# Patient Record
Sex: Female | Born: 1990 | Race: Black or African American | Hispanic: No | Marital: Single | Smoking: Never smoker
Health system: Southern US, Community
[De-identification: ages and names within clinical notes are randomized; demographics above are authoritative.]

## PROBLEM LIST (undated history)

## (undated) DIAGNOSIS — D649 Anemia, unspecified: Secondary | ICD-10-CM

---

## 2014-12-14 DIAGNOSIS — D649 Anemia, unspecified: Secondary | ICD-10-CM

## 2014-12-14 HISTORY — DX: Anemia, unspecified: D64.9

## 2015-10-24 ENCOUNTER — Emergency Department (HOSPITAL_BASED_OUTPATIENT_CLINIC_OR_DEPARTMENT_OTHER)
Admission: EM | Admit: 2015-10-24 | Discharge: 2015-10-24 | Disposition: A | Discharge status: Home / Self Care | Payer: Self-pay | Attending: Emergency Medicine | Admitting: Emergency Medicine

## 2015-10-24 ENCOUNTER — Emergency Department (HOSPITAL_BASED_OUTPATIENT_CLINIC_OR_DEPARTMENT_OTHER): Payer: Self-pay

## 2015-10-24 ENCOUNTER — Encounter (HOSPITAL_BASED_OUTPATIENT_CLINIC_OR_DEPARTMENT_OTHER): Payer: Self-pay | Admitting: *Deleted

## 2015-10-24 DIAGNOSIS — Z3202 Encounter for pregnancy test, result negative: Secondary | ICD-10-CM | POA: Insufficient documentation

## 2015-10-24 DIAGNOSIS — R1110 Vomiting, unspecified: Secondary | ICD-10-CM | POA: Insufficient documentation

## 2015-10-24 DIAGNOSIS — D649 Anemia, unspecified: Secondary | ICD-10-CM

## 2015-10-24 DIAGNOSIS — R0789 Other chest pain: Secondary | ICD-10-CM

## 2015-10-24 LAB — CBC WITH DIFFERENTIAL/PLATELET
BASOS PCT: 0 %
Basophils Absolute: 0 10*3/uL (ref 0.0–0.1)
EOS PCT: 1 %
Eosinophils Absolute: 0.1 10*3/uL (ref 0.0–0.7)
HEMATOCRIT: 29.6 % — AB (ref 36.0–46.0)
HEMOGLOBIN: 8.9 g/dL — AB (ref 12.0–15.0)
LYMPHS ABS: 1.9 10*3/uL (ref 0.7–4.0)
Lymphocytes Relative: 31 %
MCH: 21.4 pg — AB (ref 26.0–34.0)
MCHC: 30.1 g/dL (ref 30.0–36.0)
MCV: 71.2 fL — AB (ref 78.0–100.0)
MONO ABS: 0.5 10*3/uL (ref 0.1–1.0)
MONOS PCT: 9 %
NEUTROS ABS: 3.6 10*3/uL (ref 1.7–7.7)
Neutrophils Relative %: 59 %
Platelets: 354 10*3/uL (ref 150–400)
RBC: 4.16 MIL/uL (ref 3.87–5.11)
RDW: 16.7 % — AB (ref 11.5–15.5)
WBC: 6.1 10*3/uL (ref 4.0–10.5)

## 2015-10-24 LAB — URINALYSIS, ROUTINE W REFLEX MICROSCOPIC
Bilirubin Urine: NEGATIVE
GLUCOSE, UA: NEGATIVE mg/dL
Hgb urine dipstick: NEGATIVE
Ketones, ur: 15 mg/dL — AB
Nitrite: POSITIVE — AB
PROTEIN: NEGATIVE mg/dL
SPECIFIC GRAVITY, URINE: 1.035 — AB (ref 1.005–1.030)
Urobilinogen, UA: 1 mg/dL (ref 0.0–1.0)
pH: 5 (ref 5.0–8.0)

## 2015-10-24 LAB — BASIC METABOLIC PANEL
Anion gap: 5 (ref 5–15)
BUN: 12 mg/dL (ref 6–20)
CHLORIDE: 108 mmol/L (ref 101–111)
CO2: 24 mmol/L (ref 22–32)
CREATININE: 0.63 mg/dL (ref 0.44–1.00)
Calcium: 8.9 mg/dL (ref 8.9–10.3)
GFR calc non Af Amer: >60 mL/min (ref >60)
GLUCOSE: 95 mg/dL (ref 65–99)
Potassium: 3.1 mmol/L — ABNORMAL LOW (ref 3.5–5.1)
Sodium: 137 mmol/L (ref 135–145)

## 2015-10-24 LAB — RETICULOCYTES
RBC.: 3.9 MIL/uL (ref 3.87–5.11)
RETIC COUNT ABSOLUTE: 42.9 10*3/uL (ref 19.0–186.0)
Retic Ct Pct: 1.1 % (ref 0.4–3.1)

## 2015-10-24 LAB — IRON AND TIBC
Iron: 13 ug/dL — ABNORMAL LOW (ref 28–170)
Saturation Ratios: 3 % — ABNORMAL LOW (ref 10.4–31.8)
TIBC: 405 ug/dL (ref 250–450)
UIBC: 392 ug/dL

## 2015-10-24 LAB — VITAMIN B12: Vitamin B-12: 377 pg/mL (ref 180–914)

## 2015-10-24 LAB — FOLATE: FOLATE: 10 ng/mL (ref >5.9)

## 2015-10-24 LAB — PREGNANCY, URINE: Preg Test, Ur: NEGATIVE

## 2015-10-24 LAB — URINE MICROSCOPIC-ADD ON

## 2015-10-24 LAB — FERRITIN: FERRITIN: 2 ng/mL — AB (ref 11–307)

## 2015-10-24 MED ORDER — FERROUS SULFATE 325 (65 FE) MG PO TABS: 325.0000 mg | ORAL_TABLET | Freq: Every day | ORAL | Status: AC

## 2015-10-24 NOTE — ED Notes (Signed)
Patient transported to X-ray 

## 2015-10-24 NOTE — ED Notes (Signed)
Pt sitting up in stretcher, smiling in nad. Pt states she feels much better after the aerosol treatment. Mom is at bedside.

## 2015-10-24 NOTE — Discharge Instructions (Signed)
Anemia, Nonspecific Anemia is a condition in which the concentration of red blood cells or hemoglobin in the blood is below normal. Hemoglobin is a substance in red blood cells that carries oxygen to the tissues of the body. Anemia results in not enough oxygen reaching these tissues.  CAUSES  Common causes of anemia include:   Excessive bleeding. Bleeding may be internal or external. This includes excessive bleeding from periods (in women) or from the intestine.   Poor nutrition.   Chronic kidney, thyroid, and liver disease.  Bone marrow disorders that decrease red blood cell production.  Cancer and treatments for cancer.  HIV, AIDS, and their treatments.  Spleen problems that increase red blood cell destruction.  Blood disorders.  Excess destruction of red blood cells due to infection, medicines, and autoimmune disorders. SIGNS AND SYMPTOMS   Minor weakness.   Dizziness.   Headache.  Palpitations.   Shortness of breath, especially with exercise.   Paleness.  Cold sensitivity.  Indigestion.  Nausea.  Difficulty sleeping.  Difficulty concentrating. Symptoms may occur suddenly or they may develop slowly.  DIAGNOSIS  Additional blood tests are often needed. These help your health care provider determine the best treatment. Your health care provider will check your stool for blood and look for other causes of blood loss.  TREATMENT  Treatment varies depending on the cause of the anemia. Treatment can include:   Supplements of iron, vitamin B12, or folic acid.   Hormone medicines.   A blood transfusion. This may be needed if blood loss is severe.   Hospitalization. This may be needed if there is significant continual blood loss.   Dietary changes.  Spleen removal. HOME CARE INSTRUCTIONS Keep all follow-up appointments. It often takes many weeks to correct anemia, and having your health care provider check on your condition and your response to  treatment is very important. SEEK IMMEDIATE MEDICAL CARE IF:   You develop extreme weakness, shortness of breath, or chest pain.   You become dizzy or have trouble concentrating.  You develop heavy vaginal bleeding.   You develop a rash.   You have bloody or black, tarry stools.   You faint.   You vomit up blood.   You vomit repeatedly.   You have abdominal pain.  You have a fever or persistent symptoms for more than 2-3 days.   You have a fever and your symptoms suddenly get worse.   You are dehydrated.  MAKE SURE YOU:  Understand these instructions.  Will watch your condition.  Will get help right away if you are not doing well or get worse.   This information is not intended to replace advice given to you by your health care provider. Make sure you discuss any questions you have with your health care provider.   Document Released: 01/07/2005 Document Revised: 08/02/2013 Document Reviewed: 05/26/2013 Elsevier Interactive Patient Education 2016 [employer]  

## 2015-10-24 NOTE — ED Notes (Signed)
Pt to room 9 by ems via stretcher. Pt reports using cleaning fluids this morning at work and becoming sob/"wheezy" and having emesis due to bowel movement on linens. Pt states she felt fine yesterday, "just a little tired". Pt has albuterol treatment in progress by mask, pt states she is feeling much better with the aerosol treatment.

## 2015-10-24 NOTE — ED Notes (Signed)
Pt informed of need for ua, given instructions for cc ua and labeled cup. Will notify staff when able to obtain.

## 2015-10-24 NOTE — ED Provider Notes (Addendum)
CSN: [id number]     Arrival date & time 10/24/15  1041 History   First MD Initiated Contact with Patient 10/24/15 1055     Chief Complaint  Patient presents with  . Shortness of Breath     (Consider location/radiation/quality/duration/timing/severity/associated sxs/prior Treatment) HPI Comments: Patient is a 24 year old female with no past medical history who presents today from work with shortness of breath, sensation that her throat is closing up and wheezing. Patient states she felt her normal self when going to work today other than being tired. 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 She states today there were sheets that it had feces on them that smelled horrible causing her to gag and vomit. She went back to get a second load of laundry in the same thing happened. When this happened a second time then her throat started feeling like it was closing up she became short of breath and developed chest pain. However when patient describes her past chest pain and has been ongoing for the last 1 month. She has it at least every day that is not getting better. She was initially seen at [facility] and told everything was normal however her symptoms have not improved. The pain is generalized sharp pain in the upper portion of her chest that does not radiate. It is not associated with shortness of breath. No cough, abdominal pain, nausea or vomiting. Is not related to being. She denies any history of asthma. No recent travel, estrogen use or immobilization. No unilateral leg pain or swelling.  Patient is a 24 y.o. female presenting with shortness of breath. The history is provided by the patient.  Shortness of Breath   History reviewed. No pertinent past medical history. History reviewed. No pertinent past surgical history. History reviewed. No pertinent family history. Social History  Substance Use Topics  . Smoking status: Never Smoker   . Smokeless tobacco: None  .  Alcohol Use: None   OB History    No data available     Review of Systems  Respiratory: Positive for shortness of breath.   All other systems reviewed and are negative.     Allergies  Review of patient's allergies indicates no known allergies.  Home Medications   Prior to Admission medications   Not on File   BP 136/69 mmHg  Pulse 72  Temp(Src) 98.6 F (37 C) (Oral)  Resp 22  SpO2 100%  LMP 10/03/2015 Physical Exam  Constitutional: She is oriented to person, place, and time. She appears well-developed and well-nourished. No distress.  HENT:  Head: Normocephalic and atraumatic.  Mouth/Throat: Oropharynx is clear and moist.  Eyes: Conjunctivae and EOM are normal. Pupils are equal, round, and reactive to light.  Neck: Normal range of motion. Neck supple.  Cardiovascular: Normal rate, regular rhythm and intact distal pulses.   No murmur heard. Pulmonary/Chest: Effort normal and breath sounds normal. No respiratory distress. She has no wheezes. She has no rales. She exhibits no tenderness.  Abdominal: Soft. She exhibits no distension. There is no tenderness. There is no rebound and no guarding.  Musculoskeletal: Normal range of motion. She exhibits no edema or tenderness.  Neurological: She is alert and oriented to person, place, and time.  Skin: Skin is warm and dry. No rash noted. No erythema.  Psychiatric: She has a normal mood and affect. Her behavior is normal.  Nursing note and vitals reviewed.   ED Course  Procedures (including critical care time) Labs Review  Labs Reviewed  CBC WITH DIFFERENTIAL/PLATELET - Abnormal; Notable for the following:    Hemoglobin 8.9 (*)    HCT 29.6 (*)    MCV 71.2 (*)    MCH 21.4 (*)    RDW 16.7 (*)    All other components within normal limits  BASIC METABOLIC PANEL - Abnormal; Notable for the following:    Potassium 3.1 (*)    All other components within normal limits  URINALYSIS, ROUTINE W REFLEX [facility]CROSCOPIC ([facility]) -  Abnormal; Notable for the following:    APPearance CLOUDY (*)    Specific Gravity, Urine 1.035 (*)    Ketones, ur 15 (*)    Nitrite POSITIVE (*)    Leukocytes, UA TRACE (*)    All other components within normal limits  URINE MICROSCOPIC-ADD ON - Abnormal; Notable for the following:    Squamous Epithelial / LPF FEW (*)    Bacteria, UA MANY (*)    All other components within normal limits  PREGNANCY, URINE  VITAMIN B12  FOLATE  IRON AND TIBC  FERRITIN  RETICULOCYTES    Imaging Review Dg Chest 2 View  10/24/2015  CLINICAL DATA:  24 year old with acute onset of shortness of breath and wheezing earlier today while using unspecified cleaning solution. EXAM: CHEST  2 VIEW COMPARISON:  None. FINDINGS: Cardiomediastinal silhouette unremarkable. Lungs clear. Bronchovascular markings normal. Pulmonary vascularity normal. No pneumothorax. No pleural effusions. Visualized bony thorax intact. IMPRESSION: Normal examination. Electronically Signed   By: Hulan Saas M.D.   On: 10/24/2015 12:26   I have personally reviewed and evaluated these images and lab results as part of my medical decision-making.  ED ECG REPORT   Date: 10/24/2015  Rate: 71  Rhythm: normal sinus rhythm and sinus arrhythmia  QRS Axis: normal  Intervals: normal  ST/T Wave abnormalities: nonspecific T wave changes  Conduction Disutrbances:none  Narrative Interpretation:   Old EKG Reviewed: none available  I have personally reviewed the EKG tracing and agree with the computerized printout as noted.   MDM   Final diagnoses:  Anemia, unspecified anemia type  Atypical chest pain   patient is a 24 year old healthy female who presents for 2 separate issues. Initially she had 2 episodes of vomiting today while at work but the last episode causing a sensation of swelling in her throat and inability to get a deep breath. EMS arrived they gave her albuterol which made her feel better.  On exam there is no sign of allergic  reaction, wheezing, tachycardia or hypoxia. Feel like this is most likely a reaction to smelling foul odor that made her vomit.  Secondly patient has been having chest pain for the last 1 month that is intermittent in nature and not related to exertion, eating, deep breaths. She has no prior history of asthma she is a nonsmoker and does not use alcohol or drugs. She has regular periods which are sometimes heavy. No prior history of anemia. EKG today shows nonspecific T-wave abnormality but no acute findings otherwise. Chest x-ray within normal limits. Blood pressure within normal limits. Hemoglobin today shows evidence of anemia with a hemoglobin of 8.9 with a large platelets, stomatocytes and target cells. Mild hypokalemia but otherwise normal renal function. Patient's urine was nitrite and leukocyte positive however only 3-6 white blood cells and patient denies any urinary symptoms. Urine pregnancy test is negative.  Gave that patient follow-up information with health and wellness. She was started on iron and anemia panel was drawn. Feel this is most  likely the cause of her chest discomfort.    Gwyneth SproutWhitney Denesha Brouse, MD 10/24/15 1300  Gwyneth SproutWhitney Anarie Kalish, MD 10/24/15 1304

## 2016-12-14 HISTORY — PX: INDUCED ABORTION: SHX677

## 2018-09-06 ENCOUNTER — Other Ambulatory Visit: Payer: Self-pay

## 2018-09-06 ENCOUNTER — Encounter (HOSPITAL_BASED_OUTPATIENT_CLINIC_OR_DEPARTMENT_OTHER): Payer: Self-pay | Admitting: *Deleted

## 2018-09-06 ENCOUNTER — Emergency Department (HOSPITAL_BASED_OUTPATIENT_CLINIC_OR_DEPARTMENT_OTHER)
Admission: EM | Admit: 2018-09-06 | Discharge: 2018-09-07 | Disposition: A | Discharge status: Home / Self Care | Payer: Self-pay | Attending: Emergency Medicine | Admitting: Emergency Medicine

## 2018-09-06 DIAGNOSIS — J069 Acute upper respiratory infection, unspecified: Secondary | ICD-10-CM | POA: Insufficient documentation

## 2018-09-06 DIAGNOSIS — H66003 Acute suppurative otitis media without spontaneous rupture of ear drum, bilateral: Secondary | ICD-10-CM | POA: Insufficient documentation

## 2018-09-06 DIAGNOSIS — R0981 Nasal congestion: Secondary | ICD-10-CM | POA: Insufficient documentation

## 2018-09-06 MED ORDER — TRIAMCINOLONE ACETONIDE 55 MCG/ACT NA AERO: 2.0000 | INHALATION_SPRAY | Freq: Every day | NASAL | 12 refills | Status: AC

## 2018-09-06 MED ORDER — IBUPROFEN 400 MG PO TABS
400.0000 mg | ORAL_TABLET | Freq: Once | ORAL | Status: AC
  Administered 2018-09-06: 400 mg via ORAL
  Filled 2018-09-06: qty 1

## 2018-09-06 MED ORDER — AMOXICILLIN 500 MG PO CAPS
1000.0000 mg | ORAL_CAPSULE | Freq: Once | ORAL | Status: AC
  Administered 2018-09-06: 1000 mg via ORAL
  Filled 2018-09-06: qty 2

## 2018-09-06 MED ORDER — AMOXICILLIN 500 MG PO CAPS: 1000.0000 mg | ORAL_CAPSULE | Freq: Two times a day (BID) | ORAL | 0 refills | Status: DC

## 2018-09-06 NOTE — ED Provider Notes (Signed)
[facility] EMERGENCY DEPARTMENT Provider Note   CSN: [id number] Arrival date & time: 09/06/18  2226     History   Chief Complaint Chief Complaint  Patient presents with  . Otalgia    HPI Nancy Santana is a 27 y.o. female.  The history is provided by the patient.  She complains of bilateral ear pain and decreased which started yesterday.  Symptoms are worse in her left ear.  She rates pain at 8/10.  She denies fever, chills, sweats.  She has had a cold with nasal congestion for about the last 4 days.  She has been using Mucinex without relief.  She denies sore throat or cough.  She denies arthralgias or myalgias.  History reviewed. No pertinent past medical history.  There are no active problems to display for this patient.   History reviewed. No pertinent surgical history.   OB History   None      Home Medications    Prior to Admission medications   Medication Sig Start Date End Date Taking? Authorizing Provider  ferrous sulfate 325 (65 FE) MG tablet Take 1 tablet (325 mg total) by mouth daily. 10/24/15   Gwyneth Sprout, MD    Family History No family history on file.  Social History Social History   Tobacco Use  . Smoking status: Never Smoker  . Smokeless tobacco: Never Used  Substance Use Topics  . Alcohol use: Not Currently  . Drug use: Never     Allergies   Patient has no known allergies.   Review of Systems Review of Systems  All other systems reviewed and are negative.    Physical Exam Updated Vital Signs BP (!) 149/87   Pulse (!) 59   Temp 98.6 F (37 C) (Oral)   Resp 18   Ht 5' (1.524 m)   Wt 108.9 kg   LMP 09/02/2018   SpO2 99%   BMI 46.87 kg/m   Physical Exam  Nursing note and vitals reviewed.  27 year old female, resting comfortably and in no acute distress. Vital signs are significant for elevated blood pressure. Oxygen saturation is 99%, which is normal. Head is normocephalic and atraumatic. PERRLA, EOMI.  Oropharynx is clear.  Right tympanic membrane is faintly erythematous but with a normal light reflex.  Left tympanic membrane is thickened with a dulled light reflex and mild erythema. Neck is nontender and supple without adenopathy or JVD. Back is nontender and there is no CVA tenderness. Lungs are clear without rales, wheezes, or rhonchi. Chest is nontender. Heart has regular rate and rhythm without murmur. Abdomen is soft, flat, nontender without masses or hepatosplenomegaly and peristalsis is normoactive. Extremities have no cyanosis or edema, full range of motion is present. Skin is warm and dry without rash. Neurologic: Mental status is normal, cranial nerves are intact, there are no motor or sensory deficits.  ED Treatments / Results   Procedures Procedures   Medications Ordered in ED Medications  amoxicillin (AMOXIL) capsule 1,000 mg (has no administration in time range)  ibuprofen (ADVIL,MOTRIN) tablet 400 mg (400 mg Oral Given 09/06/18 2236)     Initial Impression / Assessment and Plan / ED Course  I have reviewed the triage vital signs and the nursing notes.  Bilateral otitis media secondary to URI.  She is discharged with prescription for amoxicillin, advised to use oxymetazoline nasal spray as needed with admonition not to use it for more than 3days.  Old records are reviewed, and she has no relevant past  visits.  Final Clinical Impressions(s) / ED Diagnoses   Final diagnoses:  Non-recurrent acute suppurative otitis media of both ears without spontaneous rupture of tympanic membranes  Upper respiratory tract infection, unspecified type    ED Discharge Orders         Ordered    amoxicillin (AMOXIL) 500 MG capsule  2 times daily     09/06/18 2337    triamcinolone (NASACORT) 55 MCG/ACT AERO nasal inhaler  Daily     09/06/18 2337           Dione BoozeGlick, Johny Pitstick, MD 09/06/18 2343

## 2018-09-06 NOTE — Discharge Instructions (Addendum)
Drink plenty of fluids.  Take ibuprofen and/or acetaminophen as needed.  Use oxymetazoline (Afrin) nasal spray as needed, but do not use for more than three days or it will make your congestion worse.

## 2018-09-06 NOTE — ED Triage Notes (Signed)
Ear pain in both ears but worse in the left ear since yesterday. She is crying at triage.

## 2020-05-31 ENCOUNTER — Emergency Department (HOSPITAL_BASED_OUTPATIENT_CLINIC_OR_DEPARTMENT_OTHER)
Admission: EM | Admit: 2020-05-31 | Discharge: 2020-05-31 | Disposition: A | Discharge status: Home / Self Care | Payer: Self-pay | Attending: Emergency Medicine | Admitting: Emergency Medicine

## 2020-05-31 ENCOUNTER — Other Ambulatory Visit: Payer: Self-pay

## 2020-05-31 ENCOUNTER — Encounter (HOSPITAL_BASED_OUTPATIENT_CLINIC_OR_DEPARTMENT_OTHER): Payer: Self-pay

## 2020-05-31 ENCOUNTER — Emergency Department (HOSPITAL_BASED_OUTPATIENT_CLINIC_OR_DEPARTMENT_OTHER): Payer: Self-pay

## 2020-05-31 DIAGNOSIS — Z20822 Contact with and (suspected) exposure to covid-19: Secondary | ICD-10-CM | POA: Insufficient documentation

## 2020-05-31 DIAGNOSIS — Z79899 Other long term (current) drug therapy: Secondary | ICD-10-CM | POA: Insufficient documentation

## 2020-05-31 DIAGNOSIS — J069 Acute upper respiratory infection, unspecified: Secondary | ICD-10-CM | POA: Insufficient documentation

## 2020-05-31 LAB — SARS CORONAVIRUS 2 BY RT PCR (HOSPITAL ORDER, PERFORMED IN ~~LOC~~ HOSPITAL LAB): SARS Coronavirus 2: NEGATIVE

## 2020-05-31 LAB — PREGNANCY, URINE: Preg Test, Ur: NEGATIVE

## 2020-05-31 LAB — GROUP A STREP BY PCR: Group A Strep by PCR: NOT DETECTED

## 2020-05-31 MED ORDER — ACETAMINOPHEN 325 MG PO TABS
650.0000 mg | ORAL_TABLET | Freq: Once | ORAL | Status: AC
  Administered 2020-05-31: 650 mg via ORAL
  Filled 2020-05-31: qty 2

## 2020-05-31 MED ORDER — BENZONATATE 100 MG PO CAPS: 100.0000 mg | ORAL_CAPSULE | Freq: Three times a day (TID) | ORAL | 0 refills | Status: AC

## 2020-05-31 MED FILL — BENZONATATE 100 MG CAPS: 100 | 7 days supply | Qty: 21 | Fill #0

## 2020-05-31 NOTE — ED Provider Notes (Signed)
[facility] EMERGENCY DEPARTMENT Provider Note   CSN: [id number] Arrival date & time: 05/31/20  1252     History Chief Complaint  Patient presents with  . Cough    Nancy Santana is a 29 y.o. female with no known past medical history.  Is up-to-date on immunizations however did not have Covid vaccinations.  HPI Patient presents to emergency department today with chief complaint of cough and rhinorrhea x2 days.  She states her cough is nonproductive.  States her nose is constantly running.  When she lays down she has difficulty breathing because she feels stopped up.  She has been taking Mucinex with minimal symptom relief.  She is also endorsing generalized body aches, sore throat, loss of sense of taste.  She is able to tolerate p.o. intake.  She denies fever, chills, hemoptysis, chest pain, shortness of breath, abdominal pain, nausea, vomiting, urinary symptoms, rash, headache, neck pain.  She denies any sick contacts.  LMP 05/21/2020.    History reviewed. No pertinent past medical history.  There are no problems to display for this patient.   History reviewed. No pertinent surgical history.   OB History   No obstetric history on file.     No family history on file.  Social History   Tobacco Use  . Smoking status: Never Smoker  . Smokeless tobacco: Never Used  Vaping Use  . Vaping Use: Never used  Substance Use Topics  . Alcohol use: Not Currently  . Drug use: Never    Home Medications Prior to Admission medications   Medication Sig Start Date End Date Taking? Authorizing Provider  amoxicillin (AMOXIL) 500 MG capsule Take 2 capsules (1,000 mg total) by mouth 2 (two) times daily. 09/06/18   Dione Booze, MD  benzonatate (TESSALON) 100 MG capsule Take 1 capsule (100 mg total) by mouth every 8 (eight) hours. 05/31/20   Yuma Blucher E, PA-C  ferrous sulfate 325 (65 FE) MG tablet Take 1 tablet (325 mg total) by mouth daily. 10/24/15   Gwyneth Sprout, MD    triamcinolone (NASACORT) 55 MCG/ACT AERO nasal inhaler Place 2 sprays into the nose daily. 09/06/18   Dione Booze, MD    Allergies    Patient has no known allergies.  Review of Systems   Review of Systems All other systems are reviewed and are negative for acute change except as noted in the HPI.  Physical Exam Updated Vital Signs BP 132/83 (BP Location: Left Arm)   Pulse 85   Temp 99 F (37.2 C) (Oral)   Resp 18   Ht 5\' 1"  (1.549 m)   Wt 113.4 kg   LMP 05/21/2020   SpO2 100%   BMI 47.24 kg/m   Physical Exam Vitals and nursing note reviewed.  Constitutional:      General: She is not in acute distress.    Appearance: She is not ill-appearing.  HENT:     Head: Normocephalic and atraumatic.     Comments: No sinus or temporal tenderness.    Right Ear: Tympanic membrane and external ear normal.     Left Ear: Tympanic membrane and external ear normal.     Nose: Rhinorrhea present.     Mouth/Throat:     Mouth: Mucous membranes are moist.     Pharynx: Oropharynx is clear.  Eyes:     General: No scleral icterus.       Right eye: No discharge.        Left eye: No discharge.  Extraocular Movements: Extraocular movements intact.     Conjunctiva/sclera: Conjunctivae normal.     Pupils: Pupils are equal, round, and reactive to light.  Neck:     Vascular: No JVD.     Comments: No meningeal signs Cardiovascular:     Rate and Rhythm: Normal rate and regular rhythm.     Pulses: Normal pulses.          Radial pulses are 2+ on the right side and 2+ on the left side.     Heart sounds: Normal heart sounds.  Pulmonary:     Comments: Lungs clear to auscultation in all fields. Symmetric chest rise. No wheezing, rales, or rhonchi.  Oxygen saturation is 100% on room air. Abdominal:     Comments: Abdomen is soft, non-distended, and non-tender in all quadrants. No rigidity, no guarding. No peritoneal signs.  Musculoskeletal:        General: Normal range of motion.     Cervical  back: Normal range of motion.  Lymphadenopathy:     Cervical: No cervical adenopathy.  Skin:    General: Skin is warm and dry.     Capillary Refill: Capillary refill takes less than 2 seconds.     Findings: No rash.  Neurological:     Mental Status: She is oriented to person, place, and time.     GCS: GCS eye subscore is 4. GCS verbal subscore is 5. GCS motor subscore is 6.     Comments: Fluent speech, no facial droop.  Psychiatric:        Behavior: Behavior normal.     ED Results / Procedures / Treatments   Labs (all labs ordered are listed, but only abnormal results are displayed) Labs Reviewed  GROUP A STREP BY PCR  SARS CORONAVIRUS 2 BY RT PCR (HOSPITAL ORDER, PERFORMED IN [facility])  PREGNANCY, URINE    EKG None  Radiology DG Chest Portable 1 View  Result Date: 05/31/2020 CLINICAL DATA:  Cough x2 days. EXAM: PORTABLE CHEST 1 VIEW COMPARISON:  October 24, 2015 FINDINGS: The heart size and mediastinal contours are within normal limits. Both lungs are clear. The visualized skeletal structures are unremarkable. IMPRESSION: No active disease. Electronically Signed   By: Aram Candela M.D.   On: 05/31/2020 15:23    Procedures Procedures (including critical care time)  Medications Ordered in ED Medications  acetaminophen (TYLENOL) tablet 650 mg (650 mg Oral Given 05/31/20 1444)    ED Course  I have reviewed the triage vital signs and the nursing notes.  Pertinent labs & imaging results that were available during my care of the patient were reviewed by me and considered in my medical decision making (see chart for details).    MDM Rules/Calculators/A&P                          History provided by patient with additional history obtained from chart review.    I have reviewed patient's EMR to obtain pertinent PMH to assist in MDM.  Symptoms and exam most suggestive of uncomplicated viral illness. DDX incluldes viral URI/LRI, COVID-19.  No travel.  No known exposures to confirmed COVID-19.    Exam is benign.  Normal WOB. No fever, tachypnea, tachycardia, hypoxemia. Lungs are CTAB. I viewed pt's chest xray and it does not suggest acute infectious processes. No significant h/o immunocompromise. Doubt bacterial bronchitis or pneumonia.  No signs or symptoms to suggest strep pharyngitis. Strep test was collected in  triage and is negative.  No clinical signs of severe illness, dehydration, to warrant further emergent work up in ER. Covid test is negative. Pregnancy test is negative. Patient ambulated in the emergency department without respiratory distress or hypoxia, SpO2 >95% on room air.  Given reassuring physical exam, symptoms, will discharge with symptomatic treatment.  The patient appears reasonably screened and/or stabilized for discharge and I doubt any other medical condition or other [facility] requiring further screening, evaluation, or treatment in the ED at this time prior to discharge. The patient is safe for discharge with strict return precautions discussed. Recommend pcp follow up if symptoms persist.   Portions of this note were generated with Dragon dictation software. Dictation errors may occur despite best attempts at proofreading.    Final Clinical Impression(s) / ED Diagnoses Final diagnoses:  Viral URI with cough    Rx / DC Orders ED Discharge Orders         Ordered    benzonatate (TESSALON) 100 MG capsule  Every 8 hours     Discontinue  Reprint     05/31/20 1616           Anahid Eskelson, Harley Hallmark, PA-C 05/31/20 1621    Fredia Sorrow, MD 06/03/20 1627

## 2020-05-31 NOTE — Discharge Instructions (Addendum)
You were seen in the emergency today for upper respiratory symptoms, we suspect your symptoms are related to allergies or a virus at this time. There is no cure. Antibiotics are not effective, because the infection is caused by a virus, not by bacteria. I have prescribed you multiple medications to treat your symptoms.  ° °-Flonase to be used 1 spray in each nostril daily.  This medication is used to treat your congestion. ° °-Tessalon can be taken once every 8 hours as needed.  This medication is used to treat your cough. ° °-Ibuprofen to be taken once every 8 hours as needed for pain. Please take this medicine with food as it can cause stomach upset and at worst stomach bleeding. Do not take other NSAIDs such as motrin, aleve, advil, naproxen, mobic, etc as they are similar. You make take tylenol per over the counter dosing with this medicine safely. ° °More Symptomatic Treatments Options: °Treatment is directed at relieving symptoms.  Treatment may include:  °Increased fluid intake. Sports drinks offer valuable electrolytes, sugars, and fluids.  °Breathing heated mist or steam (vaporizer or shower).  °Eating chicken soup or other clear broths, and maintaining good nutrition.  °Getting plenty of rest.  °Using gargles or lozenges for comfort.  °Controlling fevers with ibuprofen or acetaminophen as directed by your caregiver.  °Increasing usage of your inhaler if you have asthma.  °Return to work when your temperature has returned to normal. ° °  °You will need to follow-up with your primary care provider in 1 week if your symptoms have not improved.  If you do not have a primary care provider one is provided in your discharge instructions.  Return to the emergency department for any new or worsening symptoms including but not limited to persistent fever for 5 days, difficulty breathing, chest pain, rashes, passing out, or any other concerns.  ° °

## 2020-05-31 NOTE — ED Triage Notes (Signed)
Pt c/o flu like sx x 2 days-NAD-steady gait 

## 2021-01-22 IMAGING — DX DG CHEST 1V PORT
1 series · 1 of 1 positions shown · non-contrast
Comparison: October 24, 2015

CLINICAL DATA: Cough x2 days.

EXAM:
PORTABLE CHEST 1 VIEW

[chest ap]
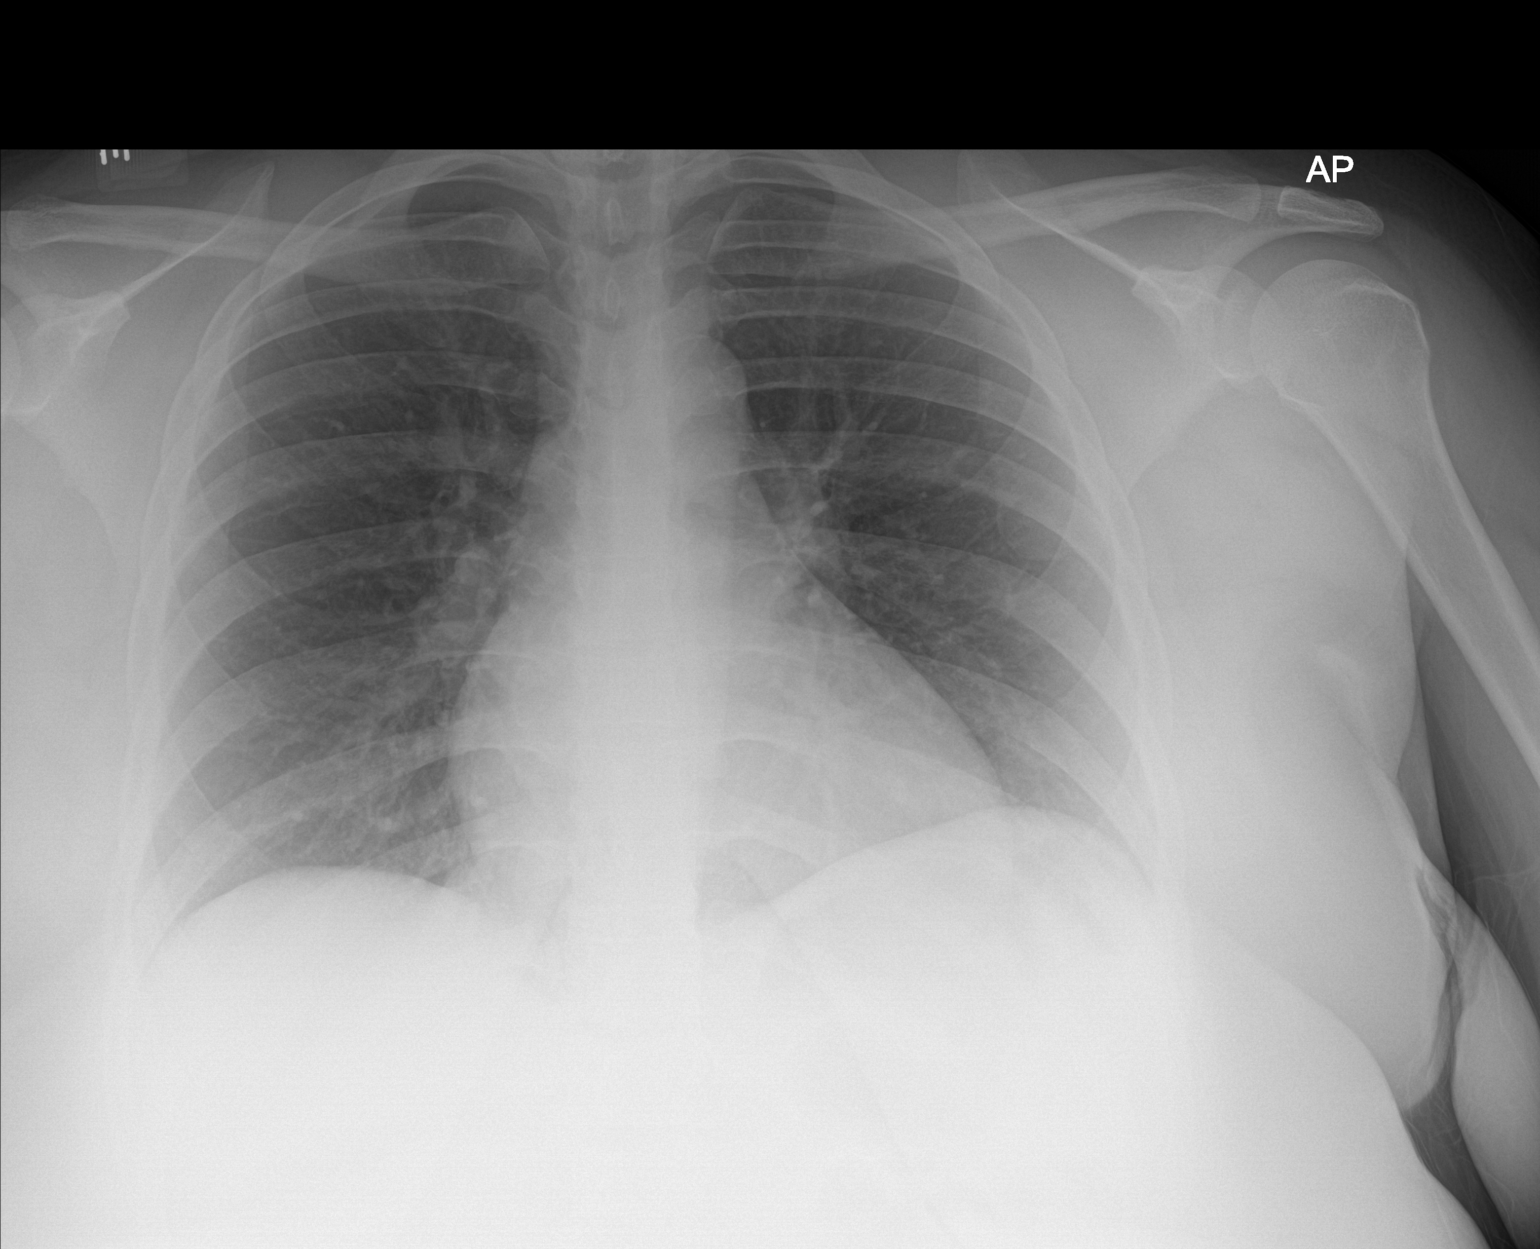

[1 of 1 positions shown; findings below may reference images not displayed]

FINDINGS: The heart size and mediastinal contours are within normal limits.
Both lungs are clear. The visualized skeletal structures are
unremarkable.
IMPRESSION: No active disease.

## 2021-06-03 ENCOUNTER — Other Ambulatory Visit: Payer: Self-pay

## 2021-06-03 ENCOUNTER — Encounter (HOSPITAL_BASED_OUTPATIENT_CLINIC_OR_DEPARTMENT_OTHER): Payer: Self-pay

## 2021-06-03 ENCOUNTER — Emergency Department (HOSPITAL_BASED_OUTPATIENT_CLINIC_OR_DEPARTMENT_OTHER): Payer: 59

## 2021-06-03 ENCOUNTER — Emergency Department (HOSPITAL_BASED_OUTPATIENT_CLINIC_OR_DEPARTMENT_OTHER)
Admission: EM | Admit: 2021-06-03 | Discharge: 2021-06-03 | Disposition: A | Discharge status: Home / Self Care | Payer: 59 | Attending: Emergency Medicine | Admitting: Emergency Medicine

## 2021-06-03 DIAGNOSIS — F17210 Nicotine dependence, cigarettes, uncomplicated: Secondary | ICD-10-CM | POA: Diagnosis not present

## 2021-06-03 DIAGNOSIS — R1011 Right upper quadrant pain: Secondary | ICD-10-CM

## 2021-06-03 DIAGNOSIS — R1013 Epigastric pain: Secondary | ICD-10-CM | POA: Diagnosis present

## 2021-06-03 DIAGNOSIS — K8020 Calculus of gallbladder without cholecystitis without obstruction: Secondary | ICD-10-CM

## 2021-06-03 HISTORY — DX: Anemia, unspecified: D64.9

## 2021-06-03 LAB — URINALYSIS, MICROSCOPIC (REFLEX)

## 2021-06-03 LAB — CBC
HCT: 30.8 % — ABNORMAL LOW (ref 36.0–46.0)
Hemoglobin: 9.6 g/dL — ABNORMAL LOW (ref 12.0–15.0)
MCH: 24.1 pg — ABNORMAL LOW (ref 26.0–34.0)
MCHC: 31.2 g/dL (ref 30.0–36.0)
MCV: 77.2 fL — ABNORMAL LOW (ref 80.0–100.0)
Platelets: 421 10*3/uL — ABNORMAL HIGH (ref 150–400)
RBC: 3.99 MIL/uL (ref 3.87–5.11)
RDW: 17.1 % — ABNORMAL HIGH (ref 11.5–15.5)
WBC: 7.2 10*3/uL (ref 4.0–10.5)
nRBC: 0 % (ref 0.0–0.2)

## 2021-06-03 LAB — PREGNANCY, URINE: Preg Test, Ur: NEGATIVE

## 2021-06-03 LAB — COMPREHENSIVE METABOLIC PANEL
ALT: 10 U/L (ref 0–44)
AST: 14 U/L — ABNORMAL LOW (ref 15–41)
Albumin: 3.5 g/dL (ref 3.5–5.0)
Alkaline Phosphatase: 58 U/L (ref 38–126)
Anion gap: 8 (ref 5–15)
BUN: 7 mg/dL (ref 6–20)
CO2: 24 mmol/L (ref 22–32)
Calcium: 8.5 mg/dL — ABNORMAL LOW (ref 8.9–10.3)
Chloride: 102 mmol/L (ref 98–111)
Creatinine, Ser: 0.59 mg/dL (ref 0.44–1.00)
GFR, Estimated: >60 mL/min (ref >60)
Glucose, Bld: 84 mg/dL (ref 70–99)
Potassium: 3.7 mmol/L (ref 3.5–5.1)
Sodium: 134 mmol/L — ABNORMAL LOW (ref 135–145)
Total Bilirubin: 0.4 mg/dL (ref 0.3–1.2)
Total Protein: 7.5 g/dL (ref 6.5–8.1)

## 2021-06-03 LAB — URINALYSIS, ROUTINE W REFLEX MICROSCOPIC
Bilirubin Urine: NEGATIVE
Glucose, UA: NEGATIVE mg/dL
Ketones, ur: NEGATIVE mg/dL
Leukocytes,Ua: NEGATIVE
Nitrite: NEGATIVE
Protein, ur: NEGATIVE mg/dL
Specific Gravity, Urine: >1.03 — ABNORMAL HIGH (ref 1.005–1.030)
pH: 5.5 (ref 5.0–8.0)

## 2021-06-03 LAB — LIPASE, BLOOD: Lipase: 21 U/L (ref 11–51)

## 2021-06-03 MED ORDER — OXYCODONE HCL 5 MG PO TABS: 5.0000 mg | ORAL_TABLET | ORAL | 0 refills | Status: AC | PRN

## 2021-06-03 MED ORDER — ONDANSETRON 4 MG PO TBDP: 8.0000 mg | ORAL_TABLET | Freq: Three times a day (TID) | ORAL | 0 refills | Status: AC | PRN

## 2021-06-03 MED ORDER — IOHEXOL 300 MG/ML  SOLN
100.0000 mL | Freq: Once | INTRAMUSCULAR | Status: AC | PRN
  Administered 2021-06-03: 100 mL via INTRAVENOUS

## 2021-06-03 MED ORDER — MORPHINE SULFATE (PF) 4 MG/ML IV SOLN
4.0000 mg | Freq: Once | INTRAVENOUS | Status: AC
  Administered 2021-06-03: 4 mg via INTRAVENOUS
  Filled 2021-06-03: qty 1

## 2021-06-03 MED ORDER — ONDANSETRON HCL 4 MG/2ML IJ SOLN
4.0000 mg | Freq: Once | INTRAMUSCULAR | Status: AC
  Administered 2021-06-03: 4 mg via INTRAVENOUS
  Filled 2021-06-03: qty 2

## 2021-06-03 MED ORDER — SODIUM CHLORIDE 0.9 % IV BOLUS
1000.0000 mL | Freq: Once | INTRAVENOUS | Status: AC
  Administered 2021-06-03: 1000 mL via INTRAVENOUS

## 2021-06-03 NOTE — Discharge Instructions (Addendum)
Take the medication as prescribed.  The pain medication is an opiate medication.  Do not drive or operate heavy machinery while taking this medication.  Take the Zofran the nausea medication as prescribed.   As discussed your ultrasound and CT scan showed gallstones.  Follow the gallbladder diet eating plan  Contact a general surgeon for poss removal of your gallbladder  Return for any new or worsening symptoms such as increased pain, persistent nausea vomiting despite medication

## 2021-06-03 NOTE — ED Triage Notes (Signed)
Pt c/o intermittent abd pain, n/v x 1 year-NAD-steady gait

## 2021-06-03 NOTE — ED Notes (Signed)
Pt tolerated [place] IV w/o difficulty, CMS of LUE WNL post IV insertion site

## 2021-06-03 NOTE — ED Provider Notes (Signed)
[facility] EMERGENCY DEPARTMENT Provider Note   CSN: [id number] Arrival date & time: 06/03/21  1150    History Chief Complaint  Patient presents with   Abdominal Pain    Burnis MedinBeyonka Eugenio is a 30 y.o. female with past medical history significant for anemia on oral iron who presents for evaluation of abdominal pain.  Has had intermittent epigastric abdominal pain over the last year.  Will intermittently occur to her right upper quadrant, epigastric region.  Has been more persistent over last month.  Will have intermittent nausea and vomiting.  No bloody emesis.  She has chronic diarrhea without melena or bright blood per rectum which has not changed in consistency.  Pain was initially associated with food intake and resolves 1-2 hours after food intake.  She has not taken any medications for this.  No history of chronic NSAID use, EtOH use. No prior abd surgeries.  Denies fever, chills, chest pain, shortness of breath, dysuria, hematuria, weakness.  Denies additional aggravating or alleviating factors.  History obtained from patient and past medical records.  No interpreter used.  HPI     Past Medical History:  Diagnosis Date   Anemia     There are no problems to display for this patient.   Past Surgical History:  Procedure Laterality Date   INDUCED ABORTION       OB History   No obstetric history on file.     No family history on file.  Social History   Tobacco Use   Smoking status: Every Day    Pack years: 0.00    Types: Cigarettes, E-cigarettes   Smokeless tobacco: Never  Vaping Use   Vaping Use: Every day  Substance Use Topics   Alcohol use: Yes    Comment: occ   Drug use: Never    Home Medications Prior to Admission medications   Medication Sig Start Date End Date Taking? Authorizing Provider  ondansetron (ZOFRAN ODT) 4 MG disintegrating tablet Take 2 tablets (8 mg total) by mouth every 8 (eight) hours as needed for nausea or vomiting. 06/03/21  Yes  Kawthar Ennen A, PA-C  oxyCODONE (ROXICODONE) 5 MG immediate release tablet Take 1 tablet (5 mg total) by mouth every 4 (four) hours as needed for up to 3 days for severe pain. 06/03/21 06/06/21 Yes Danni Leabo A, PA-C  amoxicillin (AMOXIL) 500 MG capsule Take 2 capsules (1,000 mg total) by mouth 2 (two) times daily. 09/06/18   Dione BoozeGlick, David, MD  benzonatate (TESSALON) 100 MG capsule Take 1 capsule (100 mg total) by mouth every 8 (eight) hours. 05/31/20   Walisiewicz, Yvonna AlanisKaitlyn E, PA-C  ferrous sulfate 325 (65 FE) MG tablet Take 1 tablet (325 mg total) by mouth daily. 10/24/15   Gwyneth SproutPlunkett, Whitney, MD  triamcinolone (NASACORT) 55 MCG/ACT AERO nasal inhaler Place 2 sprays into the nose daily. 09/06/18   Dione BoozeGlick, David, MD    Allergies    Patient has no known allergies.  Review of Systems   Review of Systems  Constitutional: Negative.   HENT: Negative.    Respiratory: Negative.    Cardiovascular: Negative.   Gastrointestinal:  Positive for abdominal pain, diarrhea (Chronic), nausea and vomiting. Negative for abdominal distention, anal bleeding, blood in stool, constipation and rectal pain.  Genitourinary: Negative.   Musculoskeletal: Negative.   Skin: Negative.   Neurological: Negative.   All other systems reviewed and are negative.  Physical Exam Updated Vital Signs BP 125/73 (BP Location: Right Arm)   Pulse 61  Temp 98.6 F (37 C) (Oral)   Resp 14   Ht 5\' 1"  (1.549 m)   Wt 115.2 kg   LMP 05/27/2021   SpO2 100%   BMI 47.99 kg/m   Physical Exam Vitals and nursing note reviewed.  Constitutional:      General: She is not in acute distress.    Appearance: She is well-developed. She is obese. She is not ill-appearing, toxic-appearing or diaphoretic.  HENT:     Head: Atraumatic.  Eyes:     Pupils: Pupils are equal, round, and reactive to light.  Cardiovascular:     Rate and Rhythm: Normal rate.     Heart sounds: Normal heart sounds.  Pulmonary:     Effort: Pulmonary effort  is normal. No respiratory distress.     Breath sounds: Normal breath sounds.     Comments: Clear to auscultation bilaterally.  Speaks in full sentences without difficulty Abdominal:     General: Bowel sounds are normal. There is no distension.     Palpations: Abdomen is soft.     Tenderness: There is abdominal tenderness in the right upper quadrant and epigastric area.     Hernia: No hernia is present.     Comments: Difficult exam due to body habitus however mild tenderness to right upper quadrant however negative Murphy sign.  Moderate tenderness epigastric  Musculoskeletal:        General: Normal range of motion.     Cervical back: Normal range of motion.  Skin:    General: Skin is warm and dry.     Capillary Refill: Capillary refill takes less than 2 seconds.  Neurological:     General: No focal deficit present.     Mental Status: She is alert.  Psychiatric:        Mood and Affect: Mood normal.   ED Results / Procedures / Treatments   Labs (all labs ordered are listed, but only abnormal results are displayed) Labs Reviewed  COMPREHENSIVE METABOLIC PANEL - Abnormal; Notable for the following components:      Result Value   Sodium 134 (*)    Calcium 8.5 (*)    AST 14 (*)    All other components within normal limits  CBC - Abnormal; Notable for the following components:   Hemoglobin 9.6 (*)    HCT 30.8 (*)    MCV 77.2 (*)    MCH 24.1 (*)    RDW 17.1 (*)    Platelets 421 (*)    All other components within normal limits  URINALYSIS, ROUTINE W REFLEX MICROSCOPIC - Abnormal; Notable for the following components:   APPearance CLOUDY (*)    Specific Gravity, Urine >1.030 (*)    Hgb urine dipstick SMALL (*)    All other components within normal limits  URINALYSIS, MICROSCOPIC (REFLEX) - Abnormal; Notable for the following components:   Bacteria, UA MANY (*)    All other components within normal limits  LIPASE, BLOOD  PREGNANCY, URINE    EKG None  Radiology CT Abdomen  Pelvis W Contrast  Result Date: 06/03/2021 CLINICAL DATA:  30 year old female with epigastric pain. EXAM: CT ABDOMEN AND PELVIS WITH CONTRAST TECHNIQUE: Multidetector CT imaging of the abdomen and pelvis was performed using the standard protocol following bolus administration of intravenous contrast. CONTRAST:  34 OMNIPAQUE IOHEXOL 300 MG/ML  SOLN COMPARISON:  Abdominal ultrasound dated 06/03/2021 FINDINGS: Lower chest: The visualized lung bases are clear. No intra-abdominal free air. Trace free fluid within the pelvis. Hepatobiliary: The liver  is unremarkable. No intrahepatic biliary ductal dilatation. Noncalcified gallstones. There is mild gallbladder wall thickening and pericholecystic haziness. Recent gallbladder ultrasound has been performed and indeterminate for acute cholecystitis. A hepatobiliary scintigraphy may provide better evaluation of the gallbladder if there is a high clinical concern for acute cholecystitis . Pancreas: Unremarkable. No pancreatic ductal dilatation or surrounding inflammatory changes. Spleen: Normal in size without focal abnormality. Adrenals/Urinary Tract: The adrenal glands are unremarkable. The kidneys, visualized ureters, and urinary bladder appear unremarkable. Stomach/Bowel: There is no bowel obstruction or active inflammation. The appendix is normal. Vascular/Lymphatic: The abdominal aorta and IVC are unremarkable. No portal venous gas. There is no adenopathy. Reproductive: The uterus is anteverted and grossly unremarkable. 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 No adnexal masses. Other: None Musculoskeletal: No acute or significant osseous findings. IMPRESSION: 1. Cholelithiasis with mild thickened gallbladder wall. Repeat ultrasound or a hepatobiliary scintigraphy may provide better evaluation if there is high clinical concern for acute cholecystitis. 2. No bowel obstruction. Normal appendix. Electronically Signed   By: Elgie Collard M.D.   On: 06/03/2021  17:13   US Abdomen Limited RUQ (LIVER/GB)  Result Date: 06/03/2021 CLINICAL DATA:  Right upper quadrant pain, epigastric pain EXAM: ULTRASOUND ABDOMEN LIMITED RIGHT UPPER QUADRANT COMPARISON:  None. FINDINGS: Gallbladder: There is a large shadowing calculus measuring 2.9 cm. The gallbladder wall is mildly thickened measuring 0.5 cm. No sonographic Murphy sign noted by sonographer. Common bile duct: Diameter: 0.5 cm, within normal limits. Liver: No focal lesion identified. Within normal limits in parenchymal echogenicity. Portal vein is patent on color Doppler imaging with normal direction of blood flow towards the liver. Other: None. IMPRESSION: Cholelithiasis and mild gallbladder wall thickening. Negative sonographic Murphy sign. Findings are technically indeterminate for acute cholecystitis. If there is continued clinical concern recommend nuclear medicine HIDA scan or CT abdomen pelvis. Electronically Signed   By: Emmaline Kluver M.D.   On: 06/03/2021 16:21    Procedures Procedures   Medications Ordered in ED Medications  ondansetron (ZOFRAN) injection 4 mg (4 mg Intravenous Given 06/03/21 1539)  sodium chloride 0.9 % bolus 1,000 mL (0 mLs Intravenous Stopped 06/03/21 1743)  morphine 4 MG/ML injection 4 mg (4 mg Intravenous Given 06/03/21 1540)  iohexol (OMNIPAQUE) 300 MG/ML solution 100 mL (100 mLs Intravenous Contrast Given 06/03/21 1641)   ED Course  I have reviewed the triage vital signs and the nursing notes.  Pertinent labs & imaging results that were available during my care of the patient were reviewed by me and considered in my medical decision making (see chart for details).  30 year old here for epigastric abdominal pain which occasionally radiates into her right upper quadrant.  Worse with food intake.  Initial pain began almost 1 year ago however worse over last few weeks.  She is afebrile, nonseptic, not ill-appearing.  Heart and lungs clear.  Abdomen soft however tenderness to  epigastric and right upper quadrant however negative Murphy sign.  Plan on labs, imaging and reassess.  Labs and imaging personally reviewed and interpreted:  CBC without leukocytosis Metabolic panel sodium 134, no additional electrolyte, renal or liver abnormalities UA negative for infection Preg negative [place] with gallstones, minimal gallbladder wall thickening at 0.5 cm.  Recommend CT scan versus HIDA CT scan with gallstones with minimal gallbladder wall thickening again, inconclusive for cholecystitis  Patient reassessed.  She has no pain.  She is requesting p.o. intake.  Discussed inconclusive labs and imaging.  Patient states her pain today feels similar to the pain she  has had previously.  Suspect she has more so symptomatic cholelithiasis versus cholecystitis.  Will treat as such.  Low suspicion for infectious process giving timing, afebrile, no leukocytosis.   Patient reassessed.  She continues to remain pain-free.  She has been able to tolerate ginger ale as well as crackers without any emesis.  Benign abdominal exam on recheck.  Again patient history and exam more consistent with symptomatic cholelithiasis versus acute cholecystitis. She was given resources for outpatient follow-up with a general surgeon.  She was given strict return precautions.  She will return for any worsening symptoms.  Patient is nontoxic, nonseptic appearing, in no apparent distress.  Patient's pain and other symptoms adequately managed in emergency department.  Fluid bolus given.  Labs, imaging and vitals reviewed.  Patient does not meet the SIRS or Sepsis criteria.  On repeat exam patient does not have a surgical abdomin and there are no peritoneal signs.  No indication of appendicitis, bowel obstruction, bowel perforation, cholecystitis, diverticulitis, PID or ectopic pregnancy.     The patient has been appropriately medically screened and/or stabilized in the ED. I have low suspicion for any other emergent  medical condition which would require further screening, evaluation or treatment in the ED or require inpatient management.  Patient is hemodynamically stable and in no acute distress.  Patient able to ambulate in department prior to ED.  Evaluation does not show acute pathology that would require ongoing or additional emergent interventions while in the emergency department or further inpatient treatment.  I have discussed the diagnosis with the patient and answered all questions.  Pain is been managed while in the emergency department and patient has no further complaints prior to discharge.  Patient is comfortable with plan discussed in room and is stable for discharge at this time.  I have discussed strict return precautions for returning to the emergency department.  Patient was encouraged to follow-up with PCP/specialist refer to at discharge.     Discussed with attending Dr. Stevie Kern who agrees with above treatment, plan and disposition.     MDM Rules/Calculators/A&P                           Final Clinical Impression(s) / ED Diagnoses Final diagnoses:  Epigastric pain  RUQ pain  Symptomatic cholelithiasis    Rx / DC Orders ED Discharge Orders          Ordered    oxyCODONE (ROXICODONE) 5 MG immediate release tablet  Every 4 hours PRN        06/03/21 1836    ondansetron (ZOFRAN ODT) 4 MG disintegrating tablet  Every 8 hours PRN        06/03/21 1836             Amilee Janvier A, PA-C 06/03/21 1841    Tegeler, Canary Brim, MD 06/04/21 [phone]

## 2021-06-03 NOTE — ED Notes (Signed)
Pt given peanut butter crackers and ginger ale  

## 2021-07-30 ENCOUNTER — Ambulatory Visit: Payer: Self-pay | Admitting: Surgery

## 2021-07-30 NOTE — H&P (Signed)
History of Present Illness: Nancy Santana is a 30 y.o. female who was referred to me for evaluation of gallstones. About a year ago she began having intermittent epigastric and RUQ abdominal pain. It occurs about every 2-3 months but has worsened recently. The pain usually starts after eating meat or greasy food, and has sometimes lasted for a few days. It is in the epigastric and RUQ areas and radiates around the right flank to the back. She was seen in the ED at the end of June, and RUQ [place] and CT scan both showed cholelithiasis with mild gallbladder wall-thickening. She had normal LFTs and no fevers or leukocytosis, and was referred for outpatient follow up.       Review of Systems: A complete review of systems was obtained from the patient.  I have reviewed this information and discussed as appropriate with the patient.  See HPI as well for other ROS.     Medical History: Past Medical History History reviewed. No pertinent past medical history.    Patient Active Problem List Diagnosis  Calculus of gallbladder without cholecystitis without obstruction  Abdominal pain     Past Surgical History History reviewed. No pertinent surgical history.     Allergies No Known Allergies    No current outpatient medications on file prior to visit.   No current facility-administered medications on file prior to visit.     Family History Family History Problem Relation Age of Onset  High blood pressure (Hypertension) Mother        Social History   Tobacco Use Smoking Status Never Smoker Smokeless Tobacco Never Used     Social History Social History    Socioeconomic History  Marital status: Single Tobacco Use  Smoking status: Never Smoker  Smokeless tobacco: Never Used Advertising account planner Use: Never used Substance and Sexual Activity  Alcohol use: Not Currently  Drug use: Defer  Sexual activity: Defer      Objective:     Vitals:   07/30/21  1332 Pulse: 76 Temp: 36.7 C (98.1 F) SpO2: 97% Weight: (!) 115.4 kg (254 lb 6.4 oz) Height: 154.9 cm (5\' 1" )   Body mass index is 48.07 kg/m.   Physical Exam Vitals reviewed.  Constitutional:      General: She is not in acute distress.    Appearance: Normal appearance.  HENT:     Head: Normocephalic and atraumatic.  Eyes:     General: No scleral icterus.    Conjunctiva/sclera: Conjunctivae normal.  Cardiovascular:     Rate and Rhythm: Normal rate and regular rhythm.     Heart sounds: Normal heart sounds. No murmur heard. Pulmonary:     Effort: Pulmonary effort is normal. No respiratory distress.     Breath sounds: Normal breath sounds. No stridor.  Abdominal:     General: There is no distension.     Palpations: Abdomen is soft.     Tenderness: There is no abdominal tenderness.     Comments: No surgical scars.  Musculoskeletal:        General: Normal range of motion.     Cervical back: Normal range of motion.  Skin:    General: Skin is warm and dry.     Coloration: Skin is not jaundiced.  Neurological:     General: No focal deficit present.     Mental Status: She is alert and oriented to person, place, and time.  Psychiatric:        Mood and Affect: Mood normal.  Behavior: Behavior normal.        Thought Content: Thought content normal.          Assessment and Plan: Diagnoses and all orders for this visit:   Calculus of gallbladder without cholecystitis without obstruction       30 yo female with symptomatic cholelithiasis. I personally reviewed her RUQ [place] and CT scan. The gallbladder wall appears mildly thickened but there is no pericholecystic fluid or biliary ductal dilation. There is a large stone measuring about 2.5cm within the gallbladder. Laparoscopic cholecystectomy was recommended. The details of this procedure were discussed with the patient. She expressed understanding and agrees to proceed with surgery. Lap chole education booklet provided.  She will be contacted to schedule a surgery date.   Sophronia Simas, MD [facility] Surgery General, Hepatobiliary and Pancreatic Surgery 07/30/21 1:53 PM

## 2021-09-05 NOTE — Pre-Procedure Instructions (Signed)
Surgical Instructions:    Your procedure is scheduled on Wednesday, October 5th.  Report to [facility] Main Entrance "A" at 06:30 A.M., then check in with the Admitting office.  Call this number if you have any questions prior to your surgery date, or have problems the morning of surgery:  [phone]    Remember:  Do not eat or drink after midnight the night before your surgery.     DO NOT TAKE ANY MEDICATIONS THE MORNING OF SURGERY   As of today, STOP taking any Aspirin (unless otherwise instructed by your surgeon), NSAIDs such as Aleve, Naproxen, Ibuprofen, Motrin, Advil, Goody's, BC's, all herbal medications, fish oil, and all vitamins.             Special instructions:    [place]- Preparing For Surgery  Before surgery, you can play an important role. Because skin is not sterile, your skin needs to be as free of germs as possible. You can reduce the number of germs on your skin by washing with CHG (chlorahexidine gluconate) Soap before surgery.  CHG is an antiseptic cleaner which kills germs and bonds with the skin to continue killing germs even after washing.     Please do not use if you have an allergy to CHG or antibacterial soaps. If your skin becomes reddened/irritated stop using the CHG.  Do not shave (including legs and underarms) for at least 48 hours prior to first CHG shower. It is OK to shave your face.  Please follow these instructions carefully.     Shower the NIGHT BEFORE SURGERY and the MORNING OF SURGERY with CHG Soap.   If you chose to wash your hair, wash your hair first as usual with your normal shampoo. After you shampoo, rinse your hair and body thoroughly to remove the shampoo.  Then [employer] and genitals (private parts) with your normal soap and rinse thoroughly to remove soap.  After that Use CHG Soap as you would any other liquid soap. You can apply CHG directly to the skin and wash gently with a clean washcloth.   Apply the CHG Soap to your  body ONLY FROM THE NECK DOWN.  Do not use on open wounds or open sores. Avoid contact with your eyes, ears, mouth and genitals (private parts). Wash Face and genitals (private parts)  with your normal soap.   Wash thoroughly, paying special attention to the area where your surgery will be performed.  Thoroughly rinse your body with warm water from the neck down.  DO NOT shower/wash with your normal soap after using and rinsing off the CHG Soap.  Pat yourself dry with a CLEAN TOWEL.  Wear CLEAN PAJAMAS to bed the night before surgery.  Place CLEAN SHEETS on your bed the night before your surgery.  DO NOT SLEEP WITH PETS.   Day of Surgery:  Take a shower with CHG soap. Wear Clean/Comfortable clothing the morning of surgery Do not wear lotions, powders, perfumes, or deodorant.   Remember to brush your teeth WITH YOUR REGULAR TOOTHPASTE. Do not wear jewelry or makeup. DO Not wear nail polish, gel polish, artificial nails, or any other type of covering on natural nails including finger and toenails. If patients have artificial nails, gel coating, etc. that need to be removed by a nail salon please have this removed prior to surgery or surgery may need to be canceled/delayed if the surgeon/ anesthesia feels like the patient is unable to be adequately monitored. Do not shave 48 hours  prior to surgery.   Do not bring valuables to the hospital. [facility] is not responsible for any belongings or valuables.  Do NOT Smoke (Tobacco/Vaping) or drink Alcohol 24 hours prior to your procedure.  If you use a CPAP at night, you may bring all equipment for your overnight stay.   Contacts, glasses, dentures or bridgework may not be worn into surgery, please bring cases for these belongings.   For patients admitted to the hospital, discharge time will be determined by your treatment team.  Patients discharged the day of surgery will not be allowed to drive home, and someone needs to stay with them  for 24 hours.  NO VISITORS WILL BE ALLOWED IN PRE-OP WHERE PATIENTS GET READY FOR SURGERY.  ONLY 1 SUPPORT PERSON MAY BE PRESENT WHILE YOU ARE IN SURGERY.  IF YOU ARE TO BE ADMITTED, ONCE YOU ARE IN YOUR ROOM YOU WILL BE ALLOWED TWO (2) VISITORS.  Minor children may have two parents present. Special consideration for safety and communication needs will be reviewed on a case by case basis.    Please read over the following fact sheets that you were given.

## 2021-09-08 ENCOUNTER — Encounter (HOSPITAL_COMMUNITY)
Admission: RE | Admit: 2021-09-08 | Discharge: 2021-09-08 | Disposition: A | Discharge status: Home / Self Care | Payer: 59 | Source: Ambulatory Visit | Attending: Surgery | Admitting: Surgery

## 2021-09-08 ENCOUNTER — Other Ambulatory Visit: Payer: Self-pay

## 2021-09-08 ENCOUNTER — Encounter (HOSPITAL_COMMUNITY): Payer: Self-pay

## 2021-09-08 DIAGNOSIS — Z01812 Encounter for preprocedural laboratory examination: Secondary | ICD-10-CM | POA: Insufficient documentation

## 2021-09-08 LAB — CBC
HCT: 33.2 % — ABNORMAL LOW (ref 36.0–46.0)
Hemoglobin: 10 g/dL — ABNORMAL LOW (ref 12.0–15.0)
MCH: 23.6 pg — ABNORMAL LOW (ref 26.0–34.0)
MCHC: 30.1 g/dL (ref 30.0–36.0)
MCV: 78.5 fL — ABNORMAL LOW (ref 80.0–100.0)
Platelets: 484 10*3/uL — ABNORMAL HIGH (ref 150–400)
RBC: 4.23 MIL/uL (ref 3.87–5.11)
RDW: 17.3 % — ABNORMAL HIGH (ref 11.5–15.5)
WBC: 6.9 10*3/uL (ref 4.0–10.5)
nRBC: 0 % (ref 0.0–0.2)

## 2021-09-08 NOTE — Progress Notes (Signed)
PCP - denies Cardiologist - denies  PPM/ICD - denies   Chest x-ray - 05/31/20 EKG - 10/24/15 Stress Test - denies ECHO - denies Cardiac Cath - denies  Sleep Study - denies   DM: denies  Blood Thinner Instructions: n/a Aspirin Instructions: n/a  ERAS Protcol - No, NPO   COVID TEST- n/a, ambulatory   Anesthesia review: No  Patient denies shortness of breath, fever, cough and chest pain at PAT appointment   All instructions explained to the patient, with a verbal understanding of the material. Patient agrees to go over the instructions while at home for a better understanding. The opportunity to ask questions was provided.

## 2021-09-16 NOTE — Anesthesia Preprocedure Evaluation (Addendum)
Anesthesia Evaluation  Patient identified by MRN, date of birth, ID band Patient awake    Reviewed: Allergy & Precautions, NPO status , Patient's Chart, lab work & pertinent test results  History of Anesthesia Complications Negative for: history of anesthetic complications  Airway Mallampati: II  TM Distance: >3 FB Neck ROM: Full    Dental  (+) Dental Advisory Given, Teeth Intact   Pulmonary Current Smoker and Patient abstained from smoking.,    Pulmonary exam normal        Cardiovascular negative cardio ROS Normal cardiovascular exam     Neuro/Psych negative neurological ROS  negative psych ROS   GI/Hepatic negative GI ROS, Neg liver ROS,   Endo/Other  Morbid obesity  Renal/GU negative Renal ROS     Musculoskeletal negative musculoskeletal ROS (+)   Abdominal (+) + obese,   Peds  Hematology  (+) anemia ,   Anesthesia Other Findings   Reproductive/Obstetrics                           Anesthesia Physical Anesthesia Plan  ASA: 3  Anesthesia Plan: General   Post-op Pain Management:    Induction: Intravenous  PONV Risk Score and Plan: 4 or greater and Treatment may vary due to age or medical condition, Ondansetron, Scopolamine patch - Pre-op, Midazolam and Dexamethasone  Airway Management Planned: Oral ETT  Additional Equipment: None  Intra-op Plan:   Post-operative Plan: Extubation in OR  Informed Consent: I have reviewed the patients History and Physical, chart, labs and discussed the procedure including the risks, benefits and alternatives for the proposed anesthesia with the patient or authorized representative who has indicated his/her understanding and acceptance.     Dental advisory given  Plan Discussed with: CRNA and Anesthesiologist  Anesthesia Plan Comments:        Anesthesia Quick Evaluation

## 2021-09-17 ENCOUNTER — Ambulatory Visit (HOSPITAL_COMMUNITY)
Admission: RE | Admit: 2021-09-17 | Discharge: 2021-09-17 | Disposition: A | Discharge status: Home / Self Care | Payer: 59 | Attending: Surgery | Admitting: Surgery

## 2021-09-17 ENCOUNTER — Ambulatory Visit (HOSPITAL_COMMUNITY): Payer: 59 | Admitting: Anesthesiology

## 2021-09-17 ENCOUNTER — Encounter (HOSPITAL_COMMUNITY): Payer: Self-pay | Admitting: Surgery

## 2021-09-17 ENCOUNTER — Other Ambulatory Visit: Payer: Self-pay

## 2021-09-17 ENCOUNTER — Encounter (HOSPITAL_COMMUNITY)
Admission: RE | Disposition: A | Discharge status: Home / Self Care | Payer: Self-pay | Source: Home / Self Care | Attending: Surgery

## 2021-09-17 DIAGNOSIS — K8010 Calculus of gallbladder with chronic cholecystitis without obstruction: Secondary | ICD-10-CM | POA: Diagnosis not present

## 2021-09-17 DIAGNOSIS — K8020 Calculus of gallbladder without cholecystitis without obstruction: Secondary | ICD-10-CM | POA: Diagnosis present

## 2021-09-17 HISTORY — PX: CHOLECYSTECTOMY: SHX55

## 2021-09-17 LAB — POCT PREGNANCY, URINE: Preg Test, Ur: NEGATIVE

## 2021-09-17 SURGERY — LAPAROSCOPIC CHOLECYSTECTOMY
Anesthesia: General | Site: Abdomen

## 2021-09-17 MED ORDER — OXYCODONE HCL 5 MG PO TABS: 5.0000 mg | ORAL_TABLET | Freq: Once | ORAL | Status: AC | PRN

## 2021-09-17 MED ORDER — GABAPENTIN 300 MG PO CAPS
ORAL_CAPSULE | ORAL | Status: AC
  Filled 2021-09-17: qty 1

## 2021-09-17 MED ORDER — DEXAMETHASONE SODIUM PHOSPHATE 10 MG/ML IJ SOLN
INTRAMUSCULAR | Status: DC | PRN
Start: 2021-09-17 — End: 2021-09-17
  Administered 2021-09-17: 10 mg via INTRAVENOUS

## 2021-09-17 MED ORDER — 0.9 % SODIUM CHLORIDE (POUR BTL) OPTIME
TOPICAL | Status: DC | PRN
  Administered 2021-09-17: 1000 mL

## 2021-09-17 MED ORDER — HYDROCODONE-ACETAMINOPHEN 5-325 MG PO TABS: 1.0000 | ORAL_TABLET | Freq: Four times a day (QID) | ORAL | 0 refills | Status: AC | PRN

## 2021-09-17 MED ORDER — DEXAMETHASONE SODIUM PHOSPHATE 10 MG/ML IJ SOLN
INTRAMUSCULAR | Status: AC
  Filled 2021-09-17: qty 1

## 2021-09-17 MED ORDER — LIDOCAINE 2% (20 MG/ML) 5 ML SYRINGE
INTRAMUSCULAR | Status: AC
  Filled 2021-09-17: qty 5

## 2021-09-17 MED ORDER — PROMETHAZINE HCL 25 MG/ML IJ SOLN: 6.2500 mg | INTRAMUSCULAR | Status: DC | PRN

## 2021-09-17 MED ORDER — CHLORHEXIDINE GLUCONATE 0.12 % MT SOLN
15.0000 mL | Freq: Once | OROMUCOSAL | Status: AC
  Administered 2021-09-17: 15 mL via OROMUCOSAL
  Filled 2021-09-17: qty 15

## 2021-09-17 MED ORDER — CEFAZOLIN SODIUM-DEXTROSE 2-4 GM/100ML-% IV SOLN
INTRAVENOUS | Status: AC
  Filled 2021-09-17: qty 100

## 2021-09-17 MED ORDER — ONDANSETRON HCL 4 MG/2ML IJ SOLN
INTRAMUSCULAR | Status: AC
  Filled 2021-09-17: qty 2

## 2021-09-17 MED ORDER — SCOPOLAMINE 1 MG/3DAYS TD PT72
MEDICATED_PATCH | TRANSDERMAL | Status: DC | PRN
  Administered 2021-09-17: 1 via TRANSDERMAL

## 2021-09-17 MED ORDER — ROCURONIUM BROMIDE 10 MG/ML (PF) SYRINGE
PREFILLED_SYRINGE | INTRAVENOUS | Status: AC
  Filled 2021-09-17: qty 10

## 2021-09-17 MED ORDER — LACTATED RINGERS IV SOLN: INTRAVENOUS | Status: DC

## 2021-09-17 MED ORDER — MIDAZOLAM HCL 2 MG/2ML IJ SOLN
INTRAMUSCULAR | Status: AC
  Filled 2021-09-17: qty 2

## 2021-09-17 MED ORDER — FENTANYL CITRATE (PF) 250 MCG/5ML IJ SOLN
INTRAMUSCULAR | Status: AC
  Filled 2021-09-17: qty 5

## 2021-09-17 MED ORDER — CEFAZOLIN SODIUM-DEXTROSE 2-4 GM/100ML-% IV SOLN
2.0000 g | INTRAVENOUS | Status: AC
  Administered 2021-09-17: 2 g via INTRAVENOUS

## 2021-09-17 MED ORDER — SUGAMMADEX SODIUM 200 MG/2ML IV SOLN
INTRAVENOUS | Status: DC | PRN
  Administered 2021-09-17: 200 mg via INTRAVENOUS

## 2021-09-17 MED ORDER — ORAL CARE MOUTH RINSE: 15.0000 mL | Freq: Once | OROMUCOSAL | Status: AC

## 2021-09-17 MED ORDER — PROPOFOL 10 MG/ML IV BOLUS
INTRAVENOUS | Status: AC
  Filled 2021-09-17: qty 20

## 2021-09-17 MED ORDER — ACETAMINOPHEN 500 MG PO TABS
1000.0000 mg | ORAL_TABLET | ORAL | Status: AC
  Administered 2021-09-17: 1000 mg via ORAL

## 2021-09-17 MED ORDER — BUPIVACAINE-EPINEPHRINE 0.25% -1:200000 IJ SOLN
INTRAMUSCULAR | Status: DC | PRN
  Administered 2021-09-17: 20 mL

## 2021-09-17 MED ORDER — FENTANYL CITRATE (PF) 100 MCG/2ML IJ SOLN: 25.0000 ug | INTRAMUSCULAR | Status: DC | PRN

## 2021-09-17 MED ORDER — GABAPENTIN 300 MG PO CAPS
300.0000 mg | ORAL_CAPSULE | ORAL | Status: AC
  Administered 2021-09-17: 300 mg via ORAL

## 2021-09-17 MED ORDER — ONDANSETRON HCL 4 MG/2ML IJ SOLN
INTRAMUSCULAR | Status: DC | PRN
Start: 2021-09-17 — End: 2021-09-17
  Administered 2021-09-17: 4 mg via INTRAVENOUS

## 2021-09-17 MED ORDER — OXYCODONE HCL 5 MG/5ML PO SOLN: 5.0000 mg | Freq: Once | ORAL | Status: AC | PRN

## 2021-09-17 MED ORDER — BUPIVACAINE-EPINEPHRINE (PF) 0.25% -1:200000 IJ SOLN
INTRAMUSCULAR | Status: AC
  Filled 2021-09-17: qty 30

## 2021-09-17 MED ORDER — LIDOCAINE 2% (20 MG/ML) 5 ML SYRINGE
INTRAMUSCULAR | Status: DC | PRN
  Administered 2021-09-17: 60 mg via INTRAVENOUS

## 2021-09-17 MED ORDER — OXYCODONE HCL 5 MG PO TABS
ORAL_TABLET | ORAL | Status: AC
  Administered 2021-09-17: 5 mg via ORAL
  Filled 2021-09-17: qty 1

## 2021-09-17 MED ORDER — SODIUM CHLORIDE 0.9 % IR SOLN
Status: DC | PRN
  Administered 2021-09-17: 1000 mL

## 2021-09-17 MED ORDER — ROCURONIUM BROMIDE 10 MG/ML (PF) SYRINGE
PREFILLED_SYRINGE | INTRAVENOUS | Status: DC | PRN
  Administered 2021-09-17: 60 mg via INTRAVENOUS
  Administered 2021-09-17: 20 mg via INTRAVENOUS

## 2021-09-17 MED ORDER — MIDAZOLAM HCL 2 MG/2ML IJ SOLN
INTRAMUSCULAR | Status: DC | PRN
  Administered 2021-09-17: 2 mg via INTRAVENOUS

## 2021-09-17 MED ORDER — SCOPOLAMINE 1 MG/3DAYS TD PT72
MEDICATED_PATCH | TRANSDERMAL | Status: AC
  Filled 2021-09-17: qty 1

## 2021-09-17 MED ORDER — ACETAMINOPHEN 500 MG PO TABS
ORAL_TABLET | ORAL | Status: AC
  Filled 2021-09-17: qty 2

## 2021-09-17 MED ORDER — FENTANYL CITRATE (PF) 100 MCG/2ML IJ SOLN
INTRAMUSCULAR | Status: DC | PRN
  Administered 2021-09-17 (×2): 50 ug via INTRAVENOUS
  Administered 2021-09-17: 100 ug via INTRAVENOUS
  Administered 2021-09-17: 50 ug via INTRAVENOUS

## 2021-09-17 MED ORDER — PROPOFOL 10 MG/ML IV BOLUS
INTRAVENOUS | Status: DC | PRN
  Administered 2021-09-17: 200 mg via INTRAVENOUS

## 2021-09-17 SURGICAL SUPPLY — 40 items
APPLIER CLIP 5 13 M/L LIGAMAX5 (MISCELLANEOUS) ×2
BAG COUNTER SPONGE SURGICOUNT (BAG) ×2 IMPLANT
BLADE CLIPPER SURG (BLADE) IMPLANT
CANISTER SUCT 3000ML PPV (MISCELLANEOUS) ×2 IMPLANT
CHLORAPREP W/TINT 26 (MISCELLANEOUS) ×2 IMPLANT
CLIP APPLIE 5 13 M/L LIGAMAX5 (MISCELLANEOUS) ×1 IMPLANT
COVER SURGICAL LIGHT HANDLE (MISCELLANEOUS) ×2 IMPLANT
DERMABOND ADVANCED (GAUZE/BANDAGES/DRESSINGS) ×1
DERMABOND ADVANCED .7 DNX12 (GAUZE/BANDAGES/DRESSINGS) ×1 IMPLANT
ELECT REM PT RETURN 9FT ADLT (ELECTROSURGICAL) ×2
ELECTRODE REM PT RTRN 9FT ADLT (ELECTROSURGICAL) ×1 IMPLANT
GLOVE SURG POLY MICRO LF SZ5.5 (GLOVE) ×2 IMPLANT
GLOVE SURG UNDER POLY LF SZ6 (GLOVE) ×2 IMPLANT
GOWN STRL REUS W/ TWL LRG LVL3 (GOWN DISPOSABLE) ×3 IMPLANT
GOWN STRL REUS W/TWL LRG LVL3 (GOWN DISPOSABLE) ×3
KIT BASIN OR (CUSTOM PROCEDURE TRAY) ×2 IMPLANT
KIT TURNOVER KIT B (KITS) ×2 IMPLANT
L-HOOK LAP DISP 36CM (ELECTROSURGICAL) ×2
LHOOK LAP DISP 36CM (ELECTROSURGICAL) ×1 IMPLANT
NEEDLE INSUFFLATION 14GA 120MM (NEEDLE) IMPLANT
NS IRRIG 1000ML POUR BTL (IV SOLUTION) ×2 IMPLANT
PAD ARMBOARD 7.5X6 YLW CONV (MISCELLANEOUS) ×2 IMPLANT
PENCIL BUTTON HOLSTER BLD 10FT (ELECTRODE) ×2 IMPLANT
POUCH SPECIMEN RETRIEVAL 10MM (ENDOMECHANICALS) ×2 IMPLANT
SCISSORS LAP 5X35 DISP (ENDOMECHANICALS) ×2 IMPLANT
SET IRRIG TUBING LAPAROSCOPIC (IRRIGATION / IRRIGATOR) ×2 IMPLANT
SET TUBE SMOKE EVAC HIGH FLOW (TUBING) ×2 IMPLANT
SLEEVE ENDOPATH XCEL 5M (ENDOMECHANICALS) ×4 IMPLANT
SPECIMEN JAR SMALL (MISCELLANEOUS) ×2 IMPLANT
SUT MNCRL AB 4-0 PS2 18 (SUTURE) ×4 IMPLANT
SUT VIC AB 3-0 SH 27 (SUTURE)
SUT VIC AB 3-0 SH 27XBRD (SUTURE) IMPLANT
SUT VICRYL 0 UR6 27IN ABS (SUTURE) ×2 IMPLANT
TOWEL GREEN STERILE (TOWEL DISPOSABLE) ×2 IMPLANT
TOWEL GREEN STERILE FF (TOWEL DISPOSABLE) ×2 IMPLANT
TRAY LAPAROSCOPIC MC (CUSTOM PROCEDURE TRAY) ×2 IMPLANT
TROCAR XCEL 12X100 BLDLESS (ENDOMECHANICALS) IMPLANT
TROCAR XCEL BLUNT TIP 100MML (ENDOMECHANICALS) ×2 IMPLANT
TROCAR XCEL NON-BLD 5MMX100MML (ENDOMECHANICALS) ×2 IMPLANT
WATER STERILE IRR 1000ML POUR (IV SOLUTION) ×2 IMPLANT

## 2021-09-17 NOTE — H&P (Signed)
Nancy Santana is an 30 y.o. female.   Chief Complaint: gallstones HPI: Nancy Santana is a 30 y.o. female who was referred for evaluation of gallstones. About a year ago she began having intermittent epigastric and RUQ abdominal pain. It occurs about every 2-3 months but has worsened recently. The pain usually starts after eating meat or greasy food, and has sometimes lasted for a few days. It is in the epigastric and RUQ areas and radiates around the right flank to the back. She was seen in the ED at the end of June, and RUQ [place] and CT scan both showed cholelithiasis with mild gallbladder wall-thickening. She had normal LFTs and no fevers or leukocytosis, and was referred for outpatient follow up. She was seen in clinic on 8/17 and presents today for surgery. She has been avoiding fatty/greasy foods and has not had recent abdominal pain.  Past Medical History:  Diagnosis Date   Anemia 2016    Past Surgical History:  Procedure Laterality Date   INDUCED ABORTION  2018    History reviewed. No pertinent family history. Social History:  reports that she has never smoked. She has never used smokeless tobacco. She reports current alcohol use. She reports that she does not use drugs.  Allergies: No Known Allergies  Medications Prior to Admission  Medication Sig Dispense Refill   amoxicillin (AMOXIL) 500 MG capsule Take 2 capsules (1,000 mg total) by mouth 2 (two) times daily. (Patient not taking: No sig reported) 40 capsule 0   benzonatate (TESSALON) 100 MG capsule Take 1 capsule (100 mg total) by mouth every 8 (eight) hours. (Patient not taking: No sig reported) 21 capsule 0   ferrous sulfate 325 (65 FE) MG tablet Take 1 tablet (325 mg total) by mouth daily. (Patient not taking: No sig reported) 30 tablet 2   ondansetron (ZOFRAN ODT) 4 MG disintegrating tablet Take 2 tablets (8 mg total) by mouth every 8 (eight) hours as needed for nausea or vomiting. (Patient not taking: No sig reported) 20 tablet 0    triamcinolone (NASACORT) 55 MCG/ACT AERO nasal inhaler Place 2 sprays into the nose daily. (Patient not taking: No sig reported) 1 Inhaler 12    Results for orders placed or performed during the hospital encounter of 09/17/21 (from the past 48 hour(s))  Pregnancy, urine POC     Status: None   Collection Time: 09/17/21  7:12 AM  Result Value Ref Range   Preg Test, Ur NEGATIVE NEGATIVE    Comment:        THE SENSITIVITY OF THIS METHODOLOGY IS >24 mIU/mL    No results found.  Review of Systems  Constitutional:  Negative for chills and fever.  Respiratory:  Negative for wheezing and stridor.   Allergic/Immunologic: Negative for immunocompromised state.  Neurological:  Negative for facial asymmetry.   Blood pressure 124/67, pulse 88, temperature 98.9 F (37.2 C), temperature source Oral, resp. rate 18, height 5\' 1"  (1.549 m), weight 116.1 kg, last menstrual period 08/28/2021, SpO2 100 %. Physical Exam Vitals reviewed.  Constitutional:      General: She is not in acute distress.    Appearance: Normal appearance.  HENT:     Head: Normocephalic and atraumatic.  Eyes:     General: No scleral icterus.    Conjunctiva/sclera: Conjunctivae normal.  Abdominal:     General: There is no distension.     Palpations: Abdomen is soft.     Tenderness: There is no abdominal tenderness.  Musculoskeletal:  General: Normal range of motion.     Cervical back: Normal range of motion.  Skin:    General: Skin is warm and dry.  Neurological:     General: No focal deficit present.     Mental Status: She is alert and oriented to person, place, and time.     Cranial Nerves: No cranial nerve deficit.  Psychiatric:        Mood and Affect: Mood normal.        Behavior: Behavior normal.        Thought Content: Thought content normal.     Assessment/Plan 30 yo female with symptomatic cholelithiasis. Proceed to OR for laparoscopic cholecystectomy. Surgery details discussed with the patient,  informed consent obtained. Plan for discharge home from PACU.  Fritzi Mandes, MD 09/17/2021, 7:42 AM

## 2021-09-17 NOTE — Op Note (Signed)
Date: 09/17/21  Patient: Nancy Santana MRN: [id number]  Preoperative Diagnosis: Symptomatic cholelithiasis Postoperative Diagnosis: Same  Procedure: Laparoscopic cholecystectomy  Surgeon: Sophronia Simas, MD Assistant: Basilio Cairo, MD (Resident)  EBL: Minimal  Anesthesia: General endotracheal  Specimens: Gallbladder  Indications: [name] is a 30 year old female who presented with intermittent postprandial epigastric and right upper quadrant abdominal pain.  A right upper quadrant ultrasound showed a large stone within the gallbladder with no signs of acute cholecystitis.  After a discussion of the risks and benefits of surgery she agreed to proceed with cholecystectomy.  Findings: Large stone within the gallbladder, no evidence of acute cholecystitis.  Procedure details: Informed consent was obtained in the preoperative area prior to the procedure. The patient was brought to the operating room and placed on the table in the supine position.  General anesthesia was induced and appropriate lines and drains were placed for intraoperative monitoring. Perioperative antibiotics were administered per SCIP guidelines. The abdomen was prepped and draped in the usual sterile fashion. A pre-procedure timeout was taken verifying patient identity, surgical site and procedure to be performed.  A small infraumbilical skin incision was made, the subcutaneous tissue was divided with cautery, and the umbilical stalk was grasped and elevated. The fascia was incised and the peritoneal cavity was directly visualized. A 93mm Hassan trocar was placed. The peritoneal cavity was inspected with no evidence of visceral or vascular injury. Three 50mm ports were placed in the right subcostal margin, all under direct visualization. The fundus of the gallbladder was grasped and retracted cephalad. The infundibulum was retracted laterally. The cystic triangle was dissected out using cautery and blunt dissection, and the  critical view of safety was obtained. The cystic duct and cystic artery were clipped and ligated close to the gallbladder, leaving 2 clips behind on the cystic duct and cystic artery stumps. The gallbladder was taken off the liver using cautery. The specimen was placed in an endocatch bag. The surgical site was irrigated with saline until the effluent was clear. Hemostasis was achieved in the gallbladder fossa using cautery. The cystic duct and artery stumps were visually inspected and there was no evidence of bile Lamorte or bleeding. The ports were removed under direct visualization and the abdomen was desufflated. The specimen was removed through the umbilical port site, which necessitated extending the fascial opening due to the presence of a large stone within the gallbladder.  The specimen was removed and sent for routine pathology.  The umbilical port site fascia was closed with 0 vicryl figure-of-eight sutures. The skin at all port sites was closed with 4-0 monocryl subcuticular suture. Dermabond was applied.  The patient tolerated the procedure with no apparent complications.  All counts were correct x2 at the end of the procedure. The patient was extubated and taken to PACU in stable condition.  Sophronia Simas, MD 09/17/21 9:40 AM

## 2021-09-17 NOTE — Anesthesia Procedure Notes (Signed)
Procedure Name: Intubation Date/Time: 09/17/2021 8:29 AM Performed by: Barrington Ellison, CRNA Pre-anesthesia Checklist: Patient identified, Emergency Drugs available, Suction available and Patient being monitored Patient Re-evaluated:Patient Re-evaluated prior to induction Oxygen Delivery Method: Circle System Utilized Preoxygenation: Pre-oxygenation with 100% oxygen Induction Type: IV induction Ventilation: Mask ventilation without difficulty Laryngoscope Size: Mac and 3 Grade View: Grade I Tube type: Oral Tube size: 7.0 mm Number of attempts: 1 Airway Equipment and Method: Stylet and Oral airway Placement Confirmation: ETT inserted through vocal cords under direct vision, positive ETCO2 and breath sounds checked- equal and bilateral Secured at: 22 cm Tube secured with: Tape Dental Injury: Teeth and Oropharynx as per pre-operative assessment

## 2021-09-17 NOTE — Anesthesia Postprocedure Evaluation (Signed)
Anesthesia Post Note  Patient: Nancy Santana  Procedure(s) Performed: LAPAROSCOPIC CHOLECYSTECTOMY (Abdomen)     Patient location during evaluation: PACU Anesthesia Type: General Level of consciousness: awake and alert Pain management: pain level controlled Vital Signs Assessment: post-procedure vital signs reviewed and stable Respiratory status: spontaneous breathing, nonlabored ventilation and respiratory function stable Cardiovascular status: blood pressure returned to baseline and stable Postop Assessment: no apparent nausea or vomiting Anesthetic complications: no   No notable events documented.  Last Vitals:  Vitals:   09/17/21 1000 09/17/21 1015  BP: 120/79 129/77  Pulse: 77 70  Resp: 16 (!) 21  Temp:  36.7 C  SpO2: 97% 99%    Last Pain:  Vitals:   09/17/21 1015  TempSrc:   PainSc: 2                  Beryle Lathe

## 2021-09-17 NOTE — Discharge Instructions (Addendum)
[facility] [place] [facility] DISCHARGE INSTRUCTIONS  Activity No heavy lifting greater than 15 pounds for 4 weeks after surgery. Ok to shower, but do not bathe or submerge incisions underwater. Do not drive while taking narcotic pain medication.  Wound Care Your incisions are covered with skin glue called Dermabond. This will peel off on its own over time. You may shower and allow warm soapy water to run over your incisions. Gently pat dry. Do not submerge your incision underwater. Monitor your incision for any new redness, tenderness, or drainage.  When to Call us: Fever greater than 100.5 New redness, drainage, or swelling at incision site Severe pain, nausea, or vomiting Jaundice (yellowing of the whites of the eyes or skin)  Follow-up You have an appointment scheduled with Dr. Freida Busman on October 06, 2021 at 9:30am. This will be at the [facility] Surgery office at [address], [place], [place]. Please arrive at least 15 minutes prior to your scheduled appointment time.  For questions or concerns, please call the office at [phone].

## 2021-09-17 NOTE — Transfer of Care (Signed)
Immediate Anesthesia Transfer of Care Note  Patient: Nancy Santana  Procedure(s) Performed: LAPAROSCOPIC CHOLECYSTECTOMY (Abdomen)  Patient Location: PACU  Anesthesia Type:General  Level of Consciousness: oriented and drowsy  Airway & Oxygen Therapy: Patient Spontanous Breathing and Patient connected to nasal cannula oxygen  Post-op Assessment: Report given to RN  Post vital signs: Reviewed and stable  Last Vitals:  Vitals Value Taken Time  BP 138/82   Temp    Pulse 92 09/17/21 0942  Resp 23 09/17/21 0942  SpO2 97 % 09/17/21 0942  Vitals shown include unvalidated device data.  Last Pain:  Vitals:   09/17/21 0723  TempSrc:   PainSc: 0-No pain         Complications: No notable events documented.

## 2021-09-18 ENCOUNTER — Encounter (HOSPITAL_COMMUNITY): Payer: Self-pay | Admitting: Surgery

## 2021-09-18 LAB — SURGICAL PATHOLOGY

## 2022-01-25 IMAGING — US US ABDOMEN LIMITED
1 series · 14 of 25 positions shown · non-contrast
Comparison: None.

CLINICAL DATA: Right upper quadrant pain, epigastric pain

EXAM:
ULTRASOUND ABDOMEN LIMITED RIGHT UPPER QUADRANT

[Series 1: us abdomen limited · 14 of 37 slices shown]
[im 1/37]
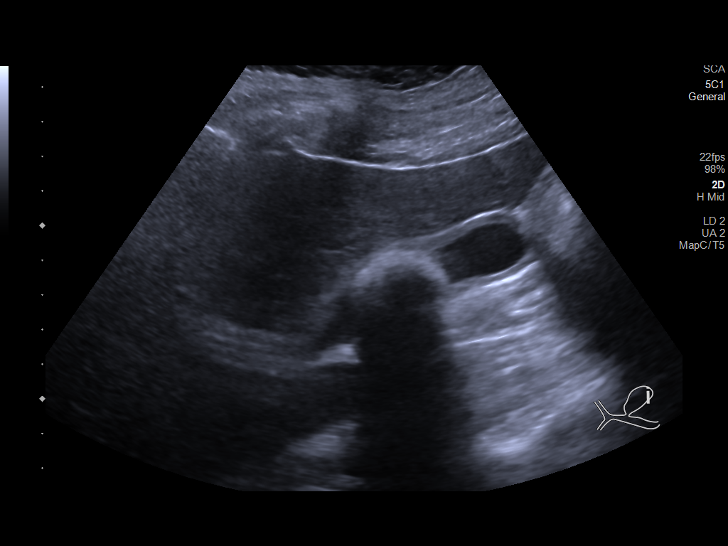
[im 4/37]
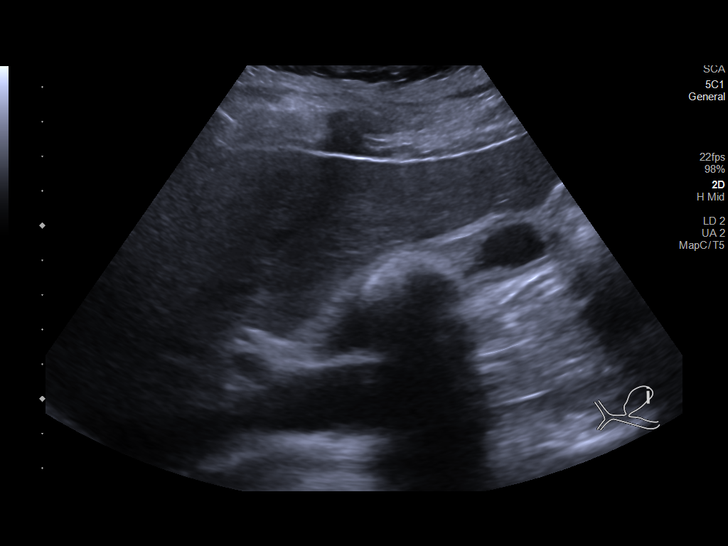
[im 7/37]
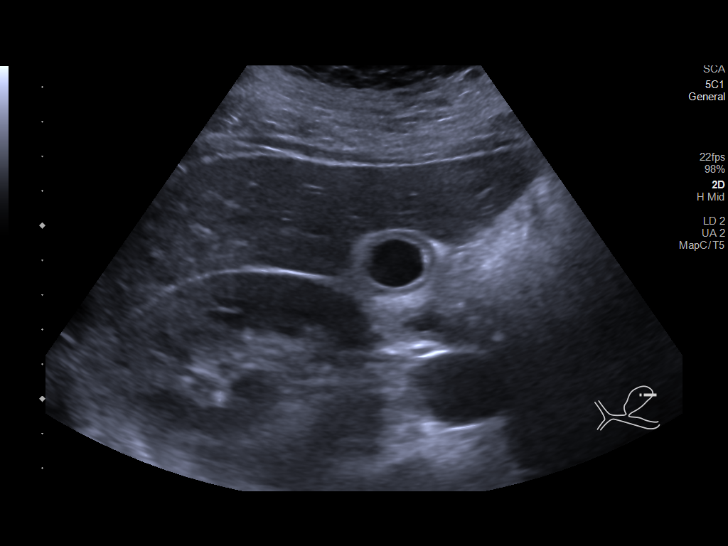
[im 10/37]
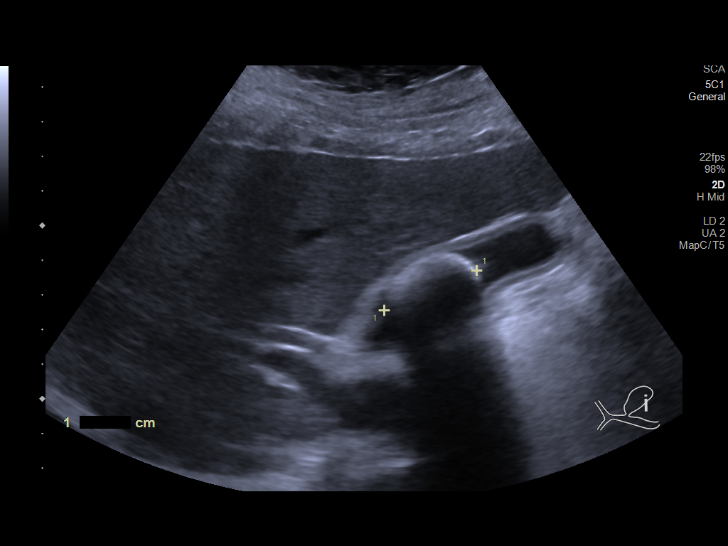
[im 13/37]
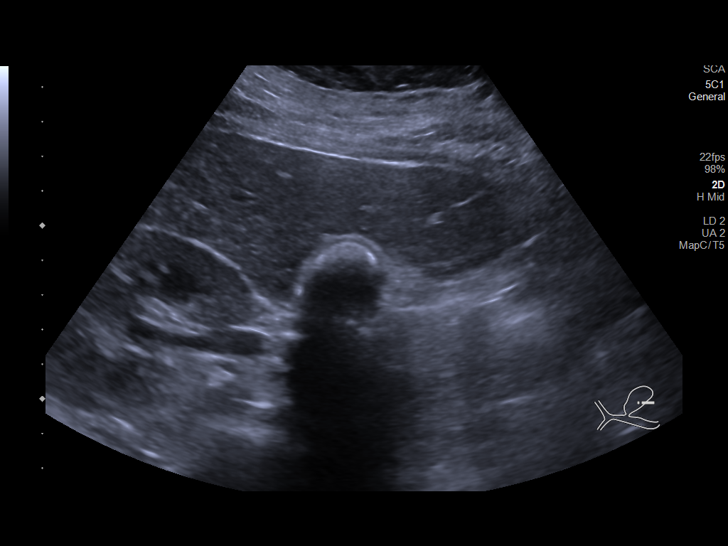
[im 14/37]
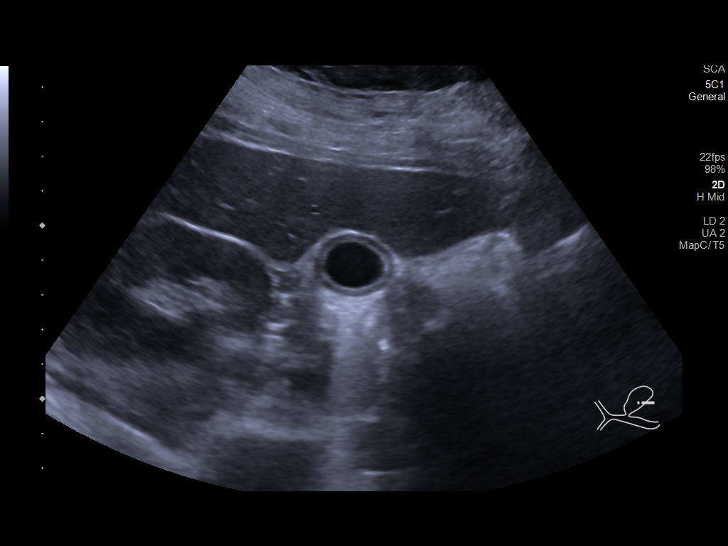
[im 17/37]
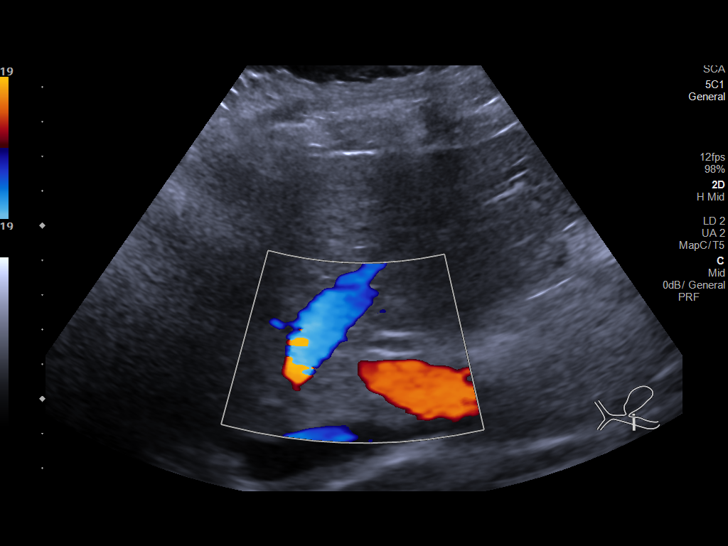
[im 20/37]
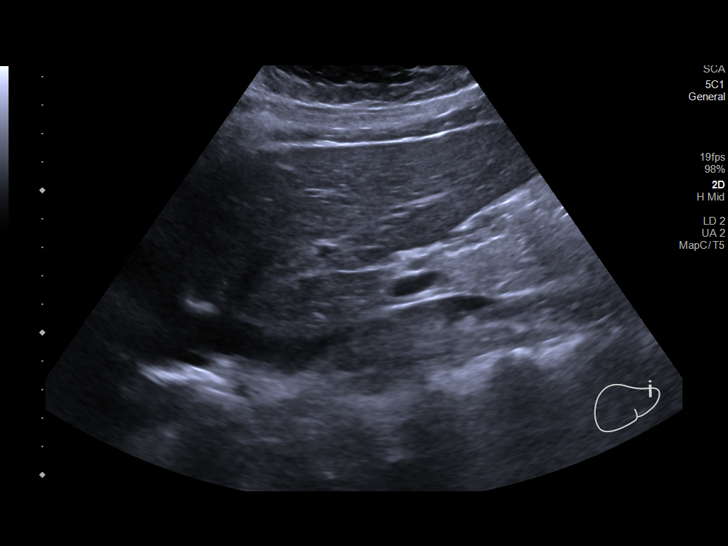
[im 23/37]
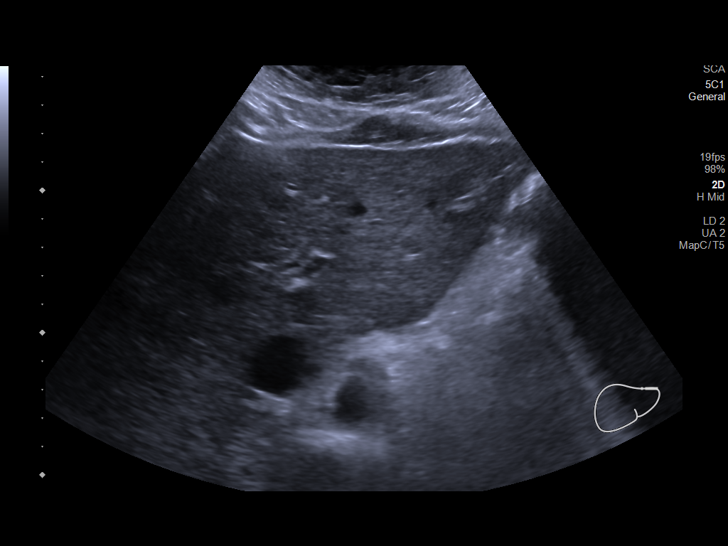
[im 25/37]
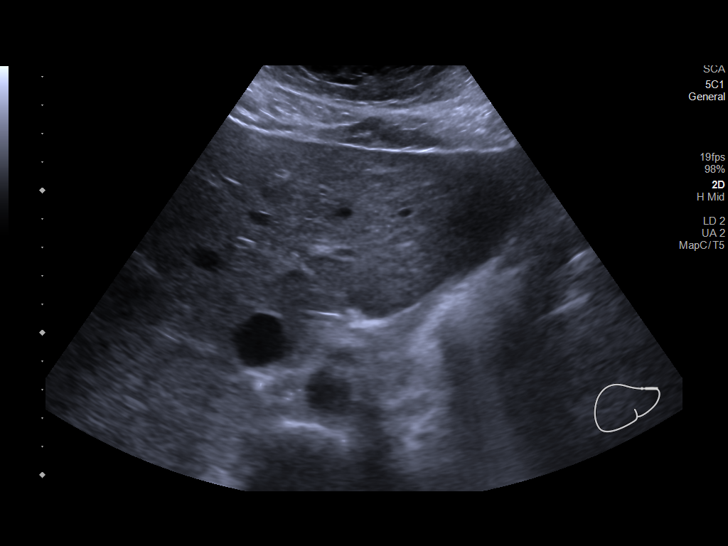
[im 28/37]
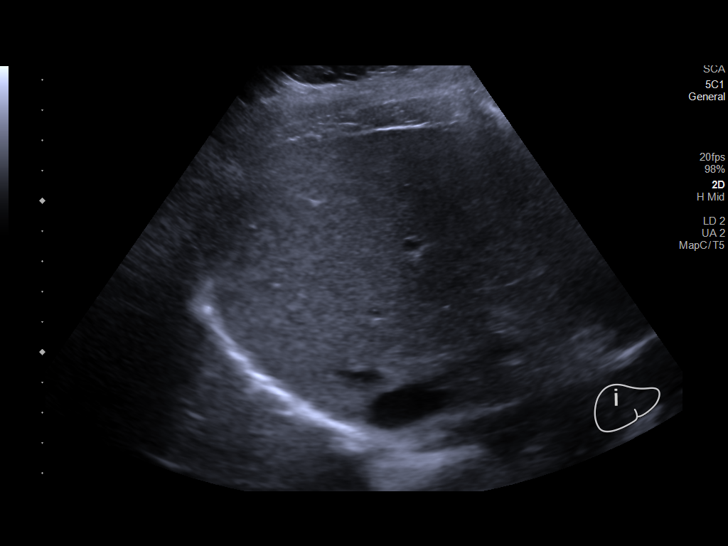
[im 31/37]
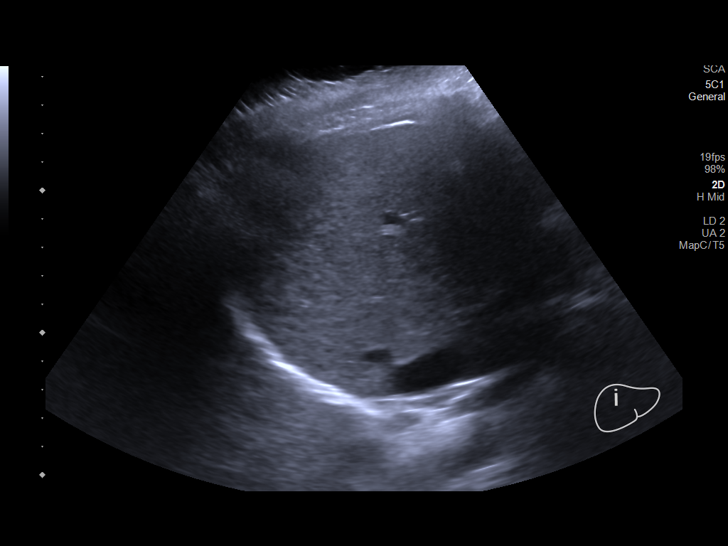
[im 34/37]
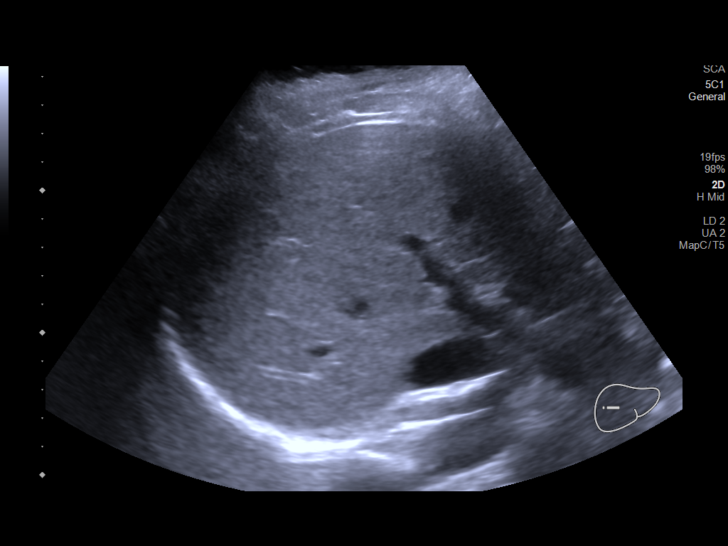
[im 37/37]
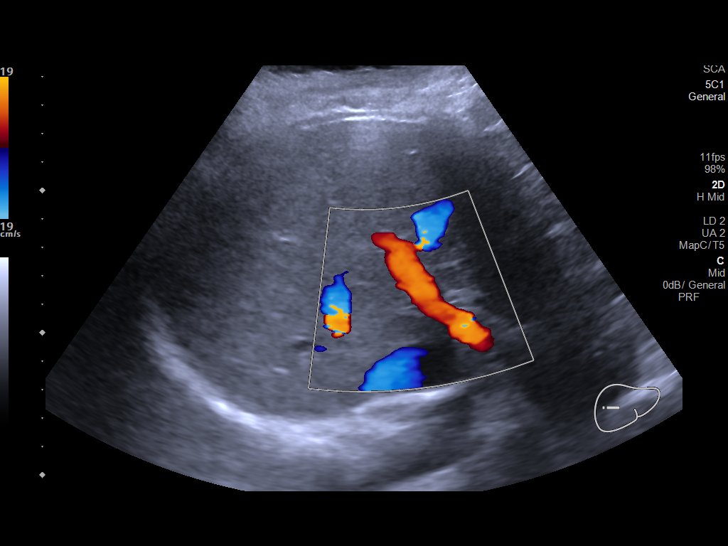

[14 of 25 positions shown; findings below may reference images not displayed]

FINDINGS: Gallbladder:

There is a large shadowing calculus measuring 2.9 cm. The
gallbladder wall is mildly thickened measuring 0.5 cm. No
sonographic Murphy sign noted by sonographer.

Common bile duct:

Diameter: 0.5 cm, within normal limits.

Liver:

No focal lesion identified. Within normal limits in parenchymal
echogenicity. Portal vein is patent on color Doppler imaging with
normal direction of blood flow towards the liver.

Other: None.
IMPRESSION: Cholelithiasis and mild gallbladder wall thickening. Negative
sonographic Murphy sign. Findings are technically indeterminate for
acute cholecystitis. If there is continued clinical concern
recommend nuclear medicine HIDA scan or CT abdomen pelvis.

## 2022-01-25 IMAGING — CT CT ABD-PELV W/ CM
2 of 4 series · 16 of 46 positions shown, 18 images · IV contrast (Omnipaque)
Comparison: Abdominal ultrasound dated 06/03/2021

CLINICAL DATA: 29-year-old female with epigastric pain.

EXAM:
CT ABDOMEN AND PELVIS WITH CONTRAST
TECHNIQUE: Multidetector CT imaging of the abdomen and pelvis was performed
using the standard protocol following bolus administration of
intravenous contrast.
CONTRAST:  100mL OMNIPAQUE IOHEXOL 300 MG/ML  SOLN

[Series 2: axial st · axial · 0.98mm/px · z∈[-397,+23]mm · 13 of 92 slices shown, 15 images]
[im 4/92  soft-tissue]
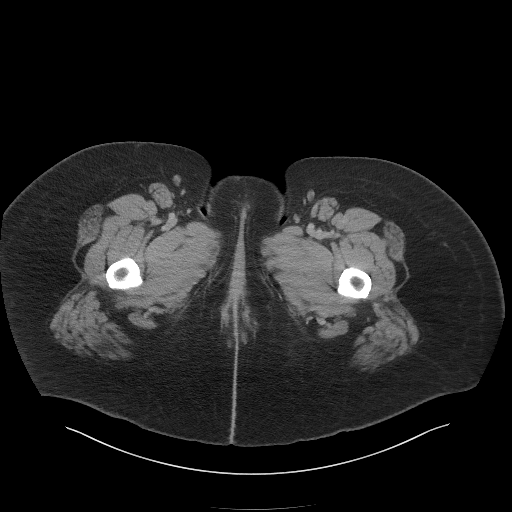
[im 4/92  bone]
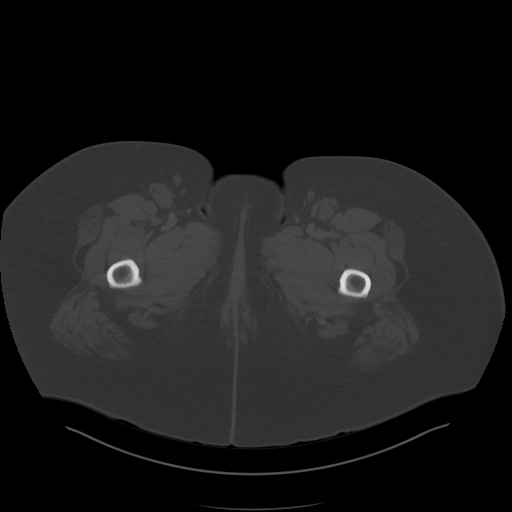
[im 11/92  soft-tissue]
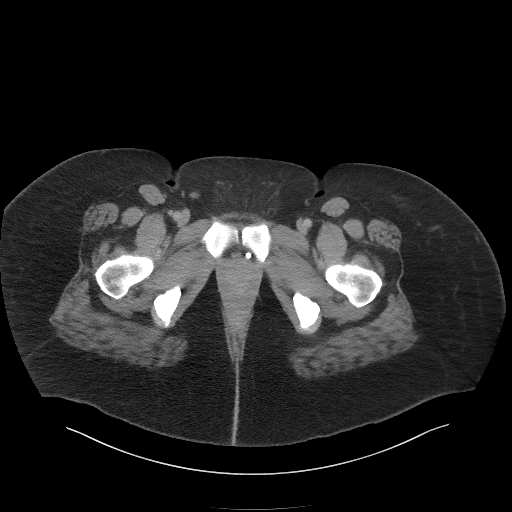
[im 19/92  soft-tissue]
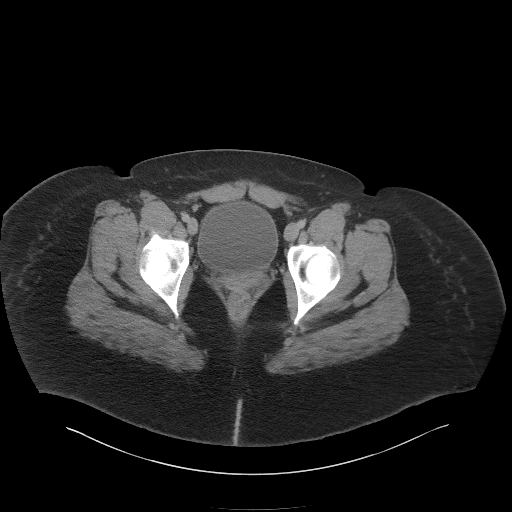
[im 26/92  soft-tissue]
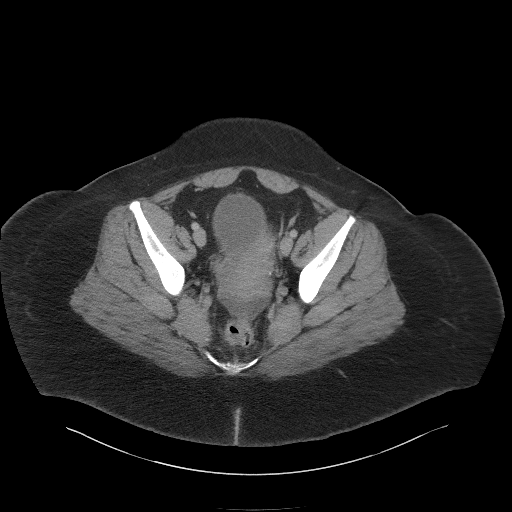
[im 33/92  soft-tissue]
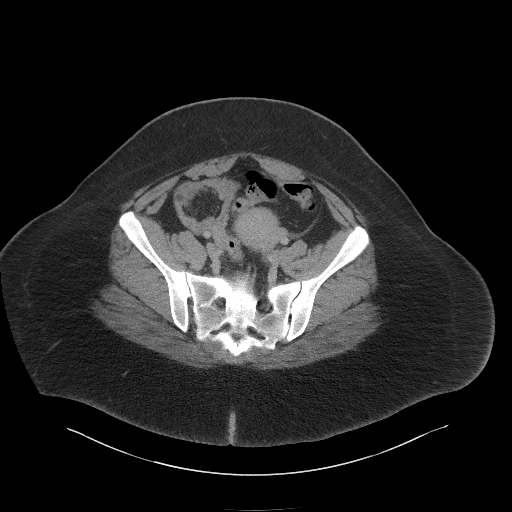
[im 41/92  soft-tissue]
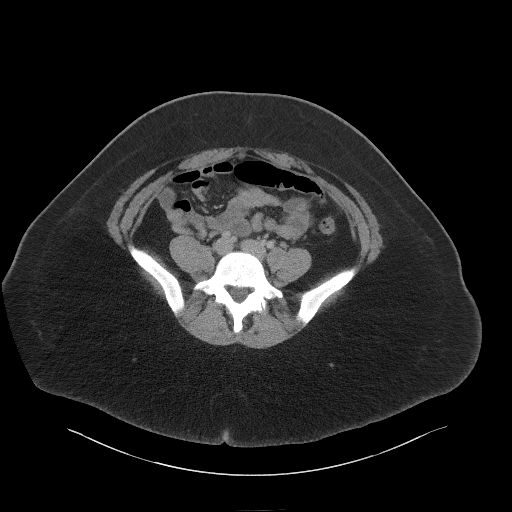
[im 48/92  soft-tissue]
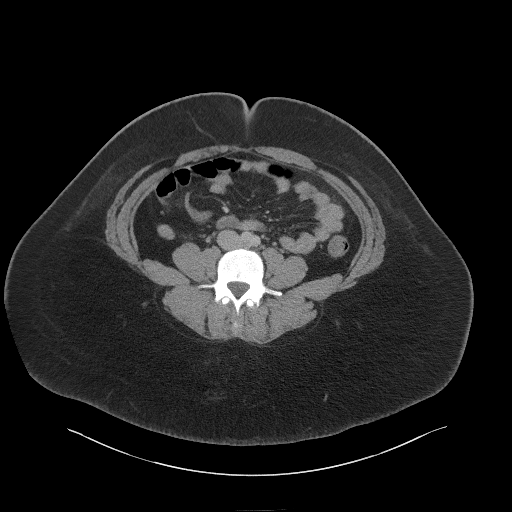
[im 51/92  soft-tissue]
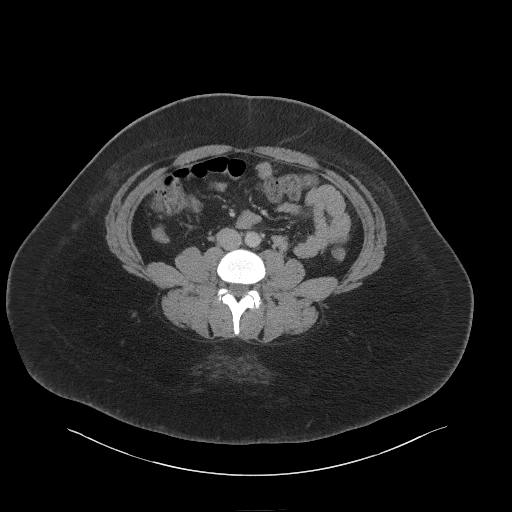
[im 59/92  soft-tissue]
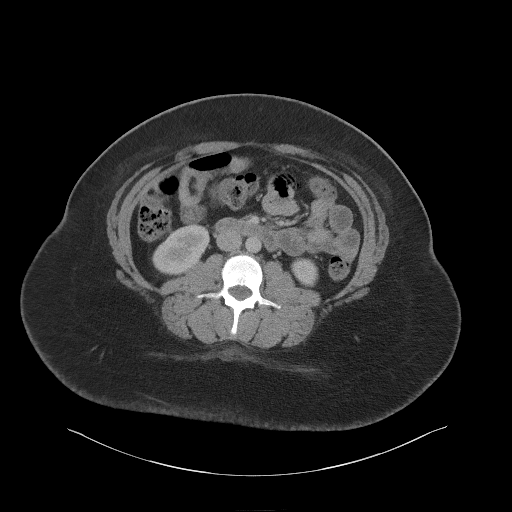
[im 59/92  bone]
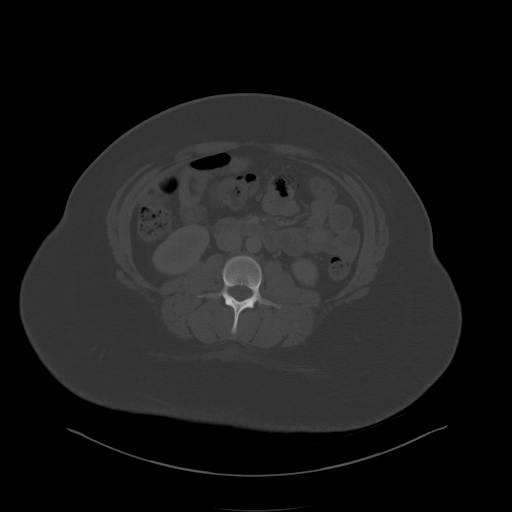
[im 66/92  soft-tissue]
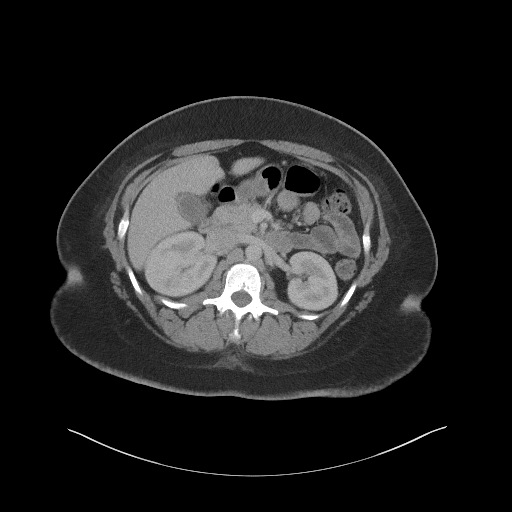
[im 73/92  soft-tissue]
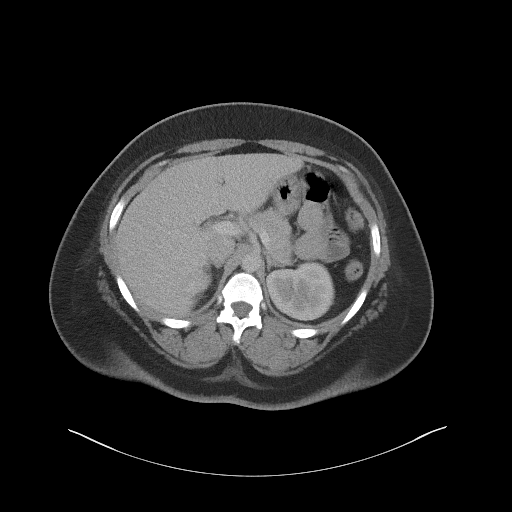
[im 81/92  soft-tissue]
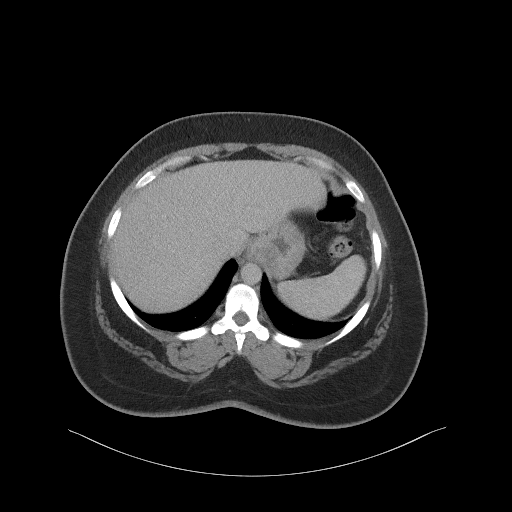
[im 88/92  soft-tissue]
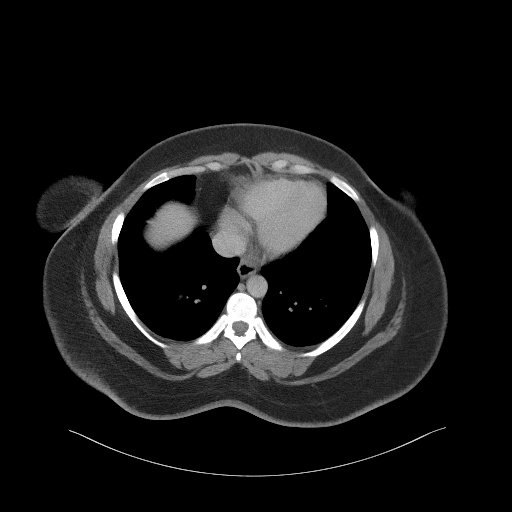

[Series 5: coronal st · coronal · 0.79mm/px · 3 of 101 slices shown]
[im 34/101  soft-tissue]
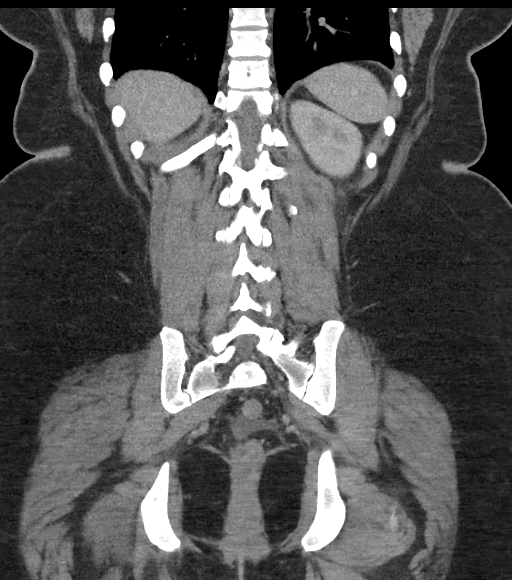
[im 45/101  soft-tissue]
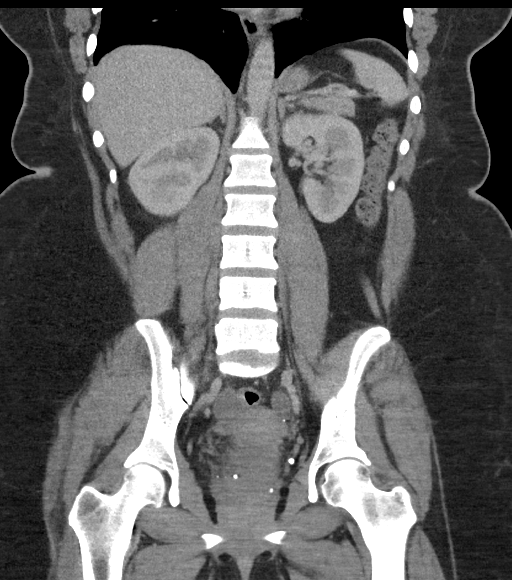
[im 56/101  soft-tissue]
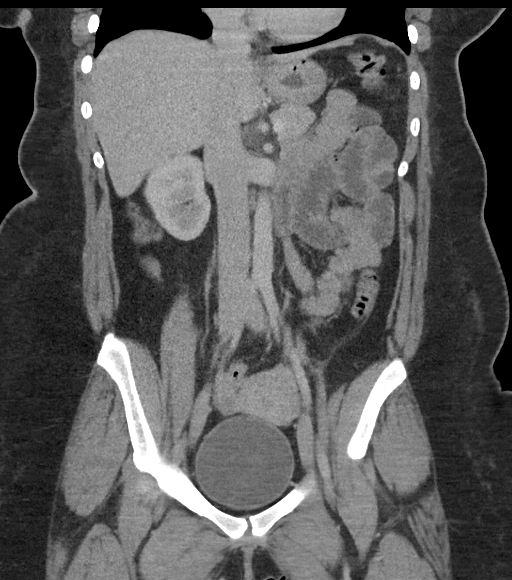

[16 of 46 positions shown; findings below may reference images not displayed]

FINDINGS: Lower chest: The visualized lung bases are clear.

No intra-abdominal free air. Trace free fluid within the pelvis.

Hepatobiliary: The liver is unremarkable. No intrahepatic biliary
ductal dilatation. Noncalcified gallstones. There is mild
gallbladder wall thickening and pericholecystic haziness. Recent
gallbladder ultrasound has been performed and indeterminate for
acute cholecystitis. A hepatobiliary scintigraphy may provide better
evaluation of the gallbladder if there is a high clinical concern
for acute cholecystitis .

Pancreas: Unremarkable. No pancreatic ductal dilatation or
surrounding inflammatory changes.

Spleen: Normal in size without focal abnormality.

Adrenals/Urinary Tract: The adrenal glands are unremarkable. The
kidneys, visualized ureters, and urinary bladder appear
unremarkable.

Stomach/Bowel: There is no bowel obstruction or active inflammation.
The appendix is normal.

Vascular/Lymphatic: The abdominal aorta and IVC are unremarkable. No
portal venous gas. There is no adenopathy.

Reproductive: The uterus is anteverted and grossly unremarkable.
Probable arcuate or bicornuate uterine morphology. No adnexal
masses.

Other: None

Musculoskeletal: No acute or significant osseous findings.
IMPRESSION: 1. Cholelithiasis with mild thickened gallbladder wall. Repeat
ultrasound or a hepatobiliary scintigraphy may provide better
evaluation if there is high clinical concern for acute
cholecystitis.
2. No bowel obstruction. Normal appendix.
